# Patient Record
Sex: Male | Born: 1940 | Race: White | Hispanic: No | Marital: Married | Smoking: Former smoker
Health system: Southern US, Community
[De-identification: ages and names within clinical notes are randomized; demographics above are authoritative.]

## PROBLEM LIST (undated history)

## (undated) DIAGNOSIS — Z85828 Personal history of other malignant neoplasm of skin: Secondary | ICD-10-CM

## (undated) DIAGNOSIS — K219 Gastro-esophageal reflux disease without esophagitis: Secondary | ICD-10-CM

## (undated) DIAGNOSIS — M1990 Unspecified osteoarthritis, unspecified site: Secondary | ICD-10-CM

## (undated) DIAGNOSIS — I499 Cardiac arrhythmia, unspecified: Secondary | ICD-10-CM

## (undated) DIAGNOSIS — C61 Malignant neoplasm of prostate: Secondary | ICD-10-CM

## (undated) DIAGNOSIS — I4891 Unspecified atrial fibrillation: Secondary | ICD-10-CM

## (undated) DIAGNOSIS — C4490 Unspecified malignant neoplasm of skin, unspecified: Secondary | ICD-10-CM

## (undated) DIAGNOSIS — R7303 Prediabetes: Secondary | ICD-10-CM

## (undated) HISTORY — PX: TONSILLECTOMY: SUR1361

## (undated) HISTORY — DX: Unspecified atrial fibrillation: I48.91

---

## 1998-04-26 ENCOUNTER — Ambulatory Visit (HOSPITAL_COMMUNITY): Admission: RE | Admit: 1998-04-26 | Discharge: 1998-04-26 | Payer: Self-pay | Admitting: Gastroenterology

## 1999-05-04 ENCOUNTER — Ambulatory Visit (HOSPITAL_COMMUNITY): Admission: RE | Admit: 1999-05-04 | Discharge: 1999-05-04 | Payer: Self-pay | Admitting: Gastroenterology

## 2001-11-19 ENCOUNTER — Ambulatory Visit (HOSPITAL_COMMUNITY): Admission: RE | Admit: 2001-11-19 | Discharge: 2001-11-19 | Payer: Self-pay | Admitting: Gastroenterology

## 2004-03-09 ENCOUNTER — Encounter (INDEPENDENT_AMBULATORY_CARE_PROVIDER_SITE_OTHER): Payer: Self-pay | Admitting: *Deleted

## 2004-03-09 ENCOUNTER — Ambulatory Visit (HOSPITAL_COMMUNITY): Admission: RE | Admit: 2004-03-09 | Discharge: 2004-03-09 | Payer: Self-pay | Admitting: Gastroenterology

## 2010-05-25 ENCOUNTER — Ambulatory Visit (HOSPITAL_COMMUNITY): Admission: RE | Admit: 2010-05-25 | Discharge: 2010-05-25 | Payer: Self-pay | Admitting: Gastroenterology

## 2010-10-29 HISTORY — PX: MELANOMA EXCISION: SHX5266

## 2011-03-16 NOTE — Op Note (Signed)
NAME:  Tanner Richardson, Tanner Richardson                      ACCOUNT NO.:  [id number]   MEDICAL RECORD NO.:  [id number]                   PATIENT TYPE:  AMB   LOCATION:  ENDO                                 FACILITY:  [facility]   PHYSICIAN:  Danise Edge, M.D.                DATE OF BIRTH:  28-Oct-1941   DATE OF PROCEDURE:  03/09/2004  DATE OF DISCHARGE:                                 OPERATIVE REPORT   PROCEDURE:  Screening colonoscopy.   INDICATIONS:  Mr. Tanner Richardson, Tanner Hageman. is a 70 year old male born Feb 28, 1941.  The patient's mother died of rectal cancer at age 54,  Mr. Tanner Richardson  has undergone colonoscopic examinations to remove neoplastic polyps.  Surveillance colonoscopy with polypectomy to prevent colon cancer is  scheduled today.   ENDOSCOPIST:  Charolett Bumpers, M.D.   PREMEDICATION:  1. Demerol 50 mg.  2. Versed 7.5 mg.   DESCRIPTION OF PROCEDURE:  After obtaining informed consent, the patient was  placed in the left lateral decubitus position.  I administered intravenous  Demerol and intravenous Versed to achieve conscious sedation for the  procedure.  The patient's blood pressure, oxygen saturation and cardiac  rhythm were monitored throughout the procedure and documented in the medical  record.   Anal inspection and digital rectal examination were normal.  The Olympus  adjustable pediatric video colonoscope was introduced into the rectum and  advanced to the cecum.  Colonic preparation for the examination today was  excellent.   FINDINGS:  RECTUM:  Normal.  SIGMOID COLON:  Normal.  DESCENDING COLON:  Normal.  SPLENIC FLEXURE:  Normal.  TRANSVERSE COLON:  Normal.  HEPATIC FLEXURE:  A diminutive 1 mm sessile polyp was removed from the  hepatic flexure, with the cold biopsy forceps.  ASCENDING COLON:  Normal.  CECUM AND ILEOCECAL VALVE:  Normal.   ASSESSMENT:  A dimunitive polyp was removed from the hepatic flexure;  otherwise normal proctocolonoscopy to the cecum.   RECOMMENDATIONS:  Repeat colonoscopy in three years.                                              Danise Edge, M.D.   MJ/MEDQ  D:  03/09/2004  T:  03/09/2004  Job:  [id number]

## 2011-03-19 ENCOUNTER — Other Ambulatory Visit: Payer: Self-pay | Admitting: Dermatology

## 2011-04-11 ENCOUNTER — Other Ambulatory Visit: Payer: Self-pay | Admitting: Dermatology

## 2011-05-14 ENCOUNTER — Other Ambulatory Visit: Payer: Self-pay | Admitting: Dermatology

## 2012-10-29 HISTORY — PX: COLONOSCOPY: SHX174

## 2013-04-09 ENCOUNTER — Other Ambulatory Visit: Payer: Self-pay

## 2013-07-07 ENCOUNTER — Ambulatory Visit (INDEPENDENT_AMBULATORY_CARE_PROVIDER_SITE_OTHER): Payer: Federal, State, Local not specified - PPO | Admitting: Internal Medicine

## 2013-07-07 ENCOUNTER — Telehealth: Payer: Self-pay | Admitting: *Deleted

## 2013-07-07 VITALS — BP 118/64 | HR 75 | Resp 18 | Ht 71.0 in | Wt 211.0 lb

## 2013-07-07 DIAGNOSIS — J3489 Other specified disorders of nose and nasal sinuses: Secondary | ICD-10-CM

## 2013-07-07 DIAGNOSIS — R040 Epistaxis: Secondary | ICD-10-CM

## 2013-07-07 NOTE — Telephone Encounter (Signed)
Called patient and left message informing him of an  ENT appt with Dr. Narda Bonds this Thursday 07/09/13 at 1 pm in the afternoon. I left the contact info [address] [place] and phone [phone].

## 2013-07-07 NOTE — Progress Notes (Signed)
  Subjective:    Patient ID: Tanner Richardson, male    DOB: 02-15-1941, 72 y.o.   MRN: [id number]  HPI 72 year old male presents for evaluation of a nosebleed. Symptoms started this afternoon while lifting weights.  He has applied pressure but over the last 3 hours it has continue to bleed. Takes baby ASA daily.  No hx of bleeding disorders but did have hx of epistaxis as a child. He did have an anterior vessel cauterized at that time. Since then has not had any problems with nosebleeds or other significant bleeding problems.  Admits over the past few months that he has had increasing number of nosebleeds, especially in the morning. Has been suffering from nasal congestion and rhinorrhea.  Has been trying not to blow his nose and mostly he has only had clear drainage.  Denies dizziness, headache, or lightheadedness.   Patient is otherwise doing well with no other concerns today.       Review of Systems  HENT: Positive for nosebleeds, rhinorrhea and postnasal drip.   Gastrointestinal: Negative for nausea and vomiting.  Neurological: Negative for dizziness and headaches.       Objective:   Physical Exam  Constitutional: He is oriented to person, place, and time. He appears well-developed and well-nourished.  HENT:  Head: Normocephalic and atraumatic.  Right Ear: External ear normal.  Left Ear: External ear normal.  Nose: Epistaxis (left nostril) is observed.  There is a small nasal polyp in left nostril  Eyes: Conjunctivae are normal.  Neck: Normal range of motion.  Neurological: He is alert and oriented to person, place, and time.  Psychiatric: He has a normal mood and affect. His behavior is normal. Judgment and thought content normal.    Large clots in both nares and bleds past the one on L into throat and nose. Cotton saturated in 2% lidocaine with epi applied to both nostrils and left in place for 10 minutes. Upon removal hemostasis was obtained and did not return. There are  bleeding spots on the L septum in particular. There is a warty lesion at the lower septum not involved with the bleeding. The clots were spit out when bleeding controlled by the epineph. No bleeding point noted on R.  Patient tolerated this well.     Assessment & Plan:  Epistaxis, recurrent - Plan: Ambulatory referral to ENT  Nasal lesion - Plan: Ambulatory referral to ENT Hemostasis obtained.   Patient discharged home with instructions on how to control bleeding if it does recur. Instructed to RTC or go to ER if he is unable to stop bleeding in 20-30 minutes.  Colonoscopy tomorrow.  Follow up here as needed. Will refer to ENT for evaluation of nasal polyp and recurrent epistaxis.

## 2013-08-26 ENCOUNTER — Other Ambulatory Visit: Payer: Self-pay | Admitting: Gastroenterology

## 2013-10-20 ENCOUNTER — Other Ambulatory Visit: Payer: Self-pay | Admitting: Urology

## 2013-10-28 ENCOUNTER — Encounter (HOSPITAL_COMMUNITY): Payer: Self-pay | Admitting: Pharmacy Technician

## 2013-11-04 ENCOUNTER — Ambulatory Visit (HOSPITAL_COMMUNITY)
Admission: RE | Admit: 2013-11-04 | Discharge: 2013-11-04 | Disposition: A | Discharge status: Home / Self Care | Payer: Federal, State, Local not specified - PPO | Source: Ambulatory Visit | Attending: Urology | Admitting: Urology

## 2013-11-04 ENCOUNTER — Encounter (HOSPITAL_COMMUNITY)
Admission: RE | Admit: 2013-11-04 | Discharge: 2013-11-04 | Disposition: A | Discharge status: Home / Self Care | Payer: Federal, State, Local not specified - PPO | Source: Ambulatory Visit | Attending: Urology | Admitting: Urology

## 2013-11-04 ENCOUNTER — Encounter (HOSPITAL_COMMUNITY): Payer: Self-pay

## 2013-11-04 DIAGNOSIS — Z01812 Encounter for preprocedural laboratory examination: Secondary | ICD-10-CM | POA: Insufficient documentation

## 2013-11-04 DIAGNOSIS — Z01818 Encounter for other preprocedural examination: Secondary | ICD-10-CM | POA: Insufficient documentation

## 2013-11-04 DIAGNOSIS — C61 Malignant neoplasm of prostate: Secondary | ICD-10-CM | POA: Insufficient documentation

## 2013-11-04 DIAGNOSIS — IMO0002 Reserved for concepts with insufficient information to code with codable children: Secondary | ICD-10-CM | POA: Insufficient documentation

## 2013-11-04 HISTORY — DX: Malignant neoplasm of prostate: C61

## 2013-11-04 HISTORY — DX: Personal history of other malignant neoplasm of skin: Z85.828

## 2013-11-04 HISTORY — DX: Unspecified osteoarthritis, unspecified site: M19.90

## 2013-11-04 LAB — BASIC METABOLIC PANEL
BUN: 12 mg/dL (ref 6–23)
CALCIUM: 9.7 mg/dL (ref 8.4–10.5)
CO2: 28 mEq/L (ref 19–32)
Chloride: 103 mEq/L (ref 96–112)
Creatinine, Ser: 0.69 mg/dL (ref 0.50–1.35)
Glucose, Bld: 82 mg/dL (ref 70–99)
POTASSIUM: 4.7 meq/L (ref 3.7–5.3)
SODIUM: 140 meq/L (ref 137–147)

## 2013-11-04 LAB — CBC
HCT: 45.8 % (ref 39.0–52.0)
HEMOGLOBIN: 15.7 g/dL (ref 13.0–17.0)
MCH: 32.7 pg (ref 26.0–34.0)
MCHC: 34.3 g/dL (ref 30.0–36.0)
MCV: 95.4 fL (ref 78.0–100.0)
PLATELETS: 255 10*3/uL (ref 150–400)
RBC: 4.8 MIL/uL (ref 4.22–5.81)
RDW: 13.6 % (ref 11.5–15.5)
WBC: 5 10*3/uL (ref 4.0–10.5)

## 2013-11-04 LAB — ABO/RH: ABO/RH(D): O POS

## 2013-11-04 NOTE — Patient Instructions (Addendum)
Tanner Richardson  11/04/2013                           YOUR PROCEDURE IS SCHEDULED ON: 11/09/13               PLEASE REPORT TO SHORT STAY CENTER AT : 5:15 AM               CALL THIS NUMBER IF ANY PROBLEMS THE DAY OF SURGERY :               [phone]                      REMEMBER:   Do not eat food or drink liquids AFTER MIDNIGHT   Take these medicines the morning of surgery with A SIP OF WATER: NONE   Do not wear jewelry, make-up   Do not wear lotions, powders, or perfumes.   Do not shave legs or underarms 12 hrs. before surgery (men may shave face)  Do not bring valuables to the hospital.  Contacts, dentures or bridgework may not be worn into surgery.  Leave suitcase in the car. After surgery it may be brought to your room.  For patients admitted to the hospital more than one night, checkout time is 11:00                          The day of discharge.   Patients discharged the day of surgery will not be allowed to drive home                             If going home same day of surgery, must have someone stay with you first                           24 hrs at home and arrange for some one to drive you home from hospital.    Special Instructions:   Please read over the following fact sheets that you were given:                1. INCENTIVE SPIROMETER                      2. Tanner Richardson                                                X_____________________________________________________________________        Failure to follow these instructions may result in cancellation of your surgery

## 2013-11-06 NOTE — H&P (Signed)
Chief Complaint Prostate Cancer   Reason For Visit Reason for consult: To discuss treatment options for prostate cancer and specifically to consider a robotic prostatectomy  PCP: Dr. Azalia Bilis  Physician requesting consult: Dr. Franchot Gallo   History of Present Illness Tanner Richardson is a 73 year old who was noted to have a persistently elevated PSA of 3.97. This prompted a prostate biopsy by Dr. 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 He has no family history of prostate cancer. His is very well informed about his treatment options through his prior discussions with Dr. [name].    TNM stage: cT1c Nx Mx  PSA: 3.97  Gleason score: 4+3=7  Biopsy (09/28/13): 2/12 cores positive -- R lateral apex (< 5%, 4+3=7), R lateral mid (70%, 4+3=7)  Prostate volume: 38.0 cc    Nomogram  OC disease: 77%  EPE: 19%  SVI: 2%  LNI: 2.2%  PFS (surgery): 94% at 5 years, 91% at 10 years    Urinary function: He has minimal bothersome voiding symptoms. IPSS is 8. His main symptoms include urinary frequency and nocturia.  Erectile function: He denies erectile dysfunction. SHIM score is 24.   Past Medical History Problems  1. History of Arthritis (V13.4) 2. History of hyperlipidemia (V12.29) 3. 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 History of Murmur (785.2)  He did undergo surgical excision of his melanoma from his right chest. This was performed by Dr. Sarajane Jews and he is followed by Dr. Amy Martinique. He has not required any additional treatment and is undergoing surveillance every 3 months.   Surgical History Problems  1. History of Excision Of Lesion Of Chest Wall  Current Meds 1. Acetaminophen CAPS; 2 qam & 2 pm for arthritis pain;  Therapy: (Recorded:13Nov2014) to Recorded 2. Aspirin 81 MG Oral Tablet;  Therapy: (Recorded:13Oct2014) to Recorded 3. Daily  Multiple Vitamins TABS;  Therapy: (Recorded:13Oct2014) to Recorded 4. Fish Oil CAPS;  Therapy: (Recorded:13Oct2014) to Recorded 5. Glucosamine-Chondroitin Oral Capsule;  Therapy: (Recorded:13Oct2014) to Recorded 6. Tylenol TABS;  Therapy: (Recorded:13Oct2014) to Recorded 7. Vitamin C TABS;  Therapy: (Recorded:13Oct2014) to Recorded  Allergies Medication  1. Meloxicam TABS  Family History Problems  1. Family history of Brain Cancer (V16.8) : Father 2. Family history of Colon Cancer (V16.0) : Mother 3. Family history of Death In The Family Father : Father   dad died at age 24 brain tumor 4. Family history of Death In The Family Mother : Father   Mom died at age 8 from colon cancer  Social History Problems    Alcohol Use   2 per day   Caffeine Use   2 per day   Former smoker (V15.82)   2 ppd for 12 yrs, quit in 1960   Marital History - Currently Married   Occupation: Retired  Review of Systems Constitutional, skin, eye, otolaryngeal, hematologic/lymphatic, cardiovascular, pulmonary, endocrine, musculoskeletal, gastrointestinal, neurological and psychiatric system(s) were reviewed and pertinent findings if present are noted.    Vitals Vital Signs [Data Includes: Last 1 Day]  Recorded: [id number] 08:55AM  Weight: 210 lb  BMI Calculated: 29.29 BSA Calculated: 2.15 Blood Pressure: 146 / 76 Heart Rate: 73  Physical Exam Constitutional: Well nourished and well developed . No acute distress.  ENT:. The ears and nose are normal in appearance.  Neck: The appearance of the neck is normal and no neck mass is present.  Pulmonary: No respiratory distress,  normal respiratory rhythm and effort and clear bilateral breath sounds.  Cardiovascular: Heart rate and rhythm are normal . No peripheral edema.  Abdomen: The abdomen is soft and nontender. No masses are palpated. No CVA tenderness. No hernias are palpable. No hepatosplenomegaly noted.  Rectal: Rectal exam  demonstrates normal sphincter tone, no tenderness and no masses. Prostate size is estimated to be 40 g. The prostate has no nodularity and is not tender. The left seminal vesicle is nonpalpable. The right seminal vesicle is nonpalpable. The perineum is normal on inspection.  Lymphatics: The femoral and inguinal nodes are not enlarged or tender.  Skin: Normal skin turgor, no visible rash and no visible skin lesions.  Neuro/Psych:. Mood and affect are appropriate.    Results/Data I have independently reviewed his medical records, PSA results, and pathology report. Findings are as dictated above.     Assessment  Assessed  1. History of Excision Of Lesion Of Chest Wall  Adenocarcinoma of prostate (185) (C61)   Plan Adenocarcinoma of prostate  1. Follow-up Keep Future Appt Office  Follow-up  Status: Complete  Done: [id number]  Discussion/Summary 1. Prostate cancer: I had a detailed discussion with Mr. Dzik and his wife today. He has elected to proceed with surgical treatment of his prostate cancer.   The patient was counseled about the natural history of prostate cancer and the standard treatment options that are available for prostate cancer. It was explained to him how his age and life expectancy, clinical stage, Gleason score, and PSA affect his prognosis, the decision to proceed with additional staging studies, as well as how that information influences recommended treatment strategies. We discussed the roles for active surveillance, radiation therapy, surgical therapy, androgen deprivation, as well as ablative therapy options for the treatment of prostate cancer as appropriate to his individual cancer situation. We discussed the risks and benefits of these options with regard to their impact on cancer control and also in terms of potential adverse events, complications, and impact on quiality of life particularly related to urinary, bowel, and sexual function. The patient was encouraged to  ask questions throughout the discussion today and all questions were answered to his stated satisfaction. In addition, the patient was provided with and/or directed to appropriate resources and literature for further education about prostate cancer and treatment options.   We discussed surgical therapy for prostate cancer including the different available surgical approaches. We discussed, in detail, the risks and expectations of surgery with regard to cancer control, urinary control, and erectile function as well as the expected postoperative recovery process. Additional risks of surgery including but not limited to bleeding, infection, hernia formation, nerve damage, lymphocele formation, bowel/rectal injury potentially necessitating colostomy, damage to the urinary tract resulting in urine leakage, urethral stricture, and the cardiopulmonary risks such as myocardial infarction, stroke, death, venothromboembolism, etc. were explained. The risk of open surgical conversion for robotic/laparoscopic prostatectomy was also discussed.     We will proceed with a bilateral nerve sparing robotic-assisted laparoscopic radical prostatectomy and bilateral pelvic lymphadenectomy.    Cc: Dr. Franchot Gallo  Dr. Azalia Bilis    SignaturesElectronically signed by : Tanner Richardson, M.D.; Nov 03 2013  3:22PM EST

## 2013-11-09 ENCOUNTER — Encounter (HOSPITAL_COMMUNITY)
Admission: RE | Disposition: A | Discharge status: Home / Self Care | Payer: Self-pay | Source: Ambulatory Visit | Attending: Urology

## 2013-11-09 ENCOUNTER — Inpatient Hospital Stay (HOSPITAL_COMMUNITY): Payer: Medicare Other | Admitting: Anesthesiology

## 2013-11-09 ENCOUNTER — Encounter (HOSPITAL_COMMUNITY): Payer: Self-pay | Admitting: *Deleted

## 2013-11-09 ENCOUNTER — Encounter (HOSPITAL_COMMUNITY): Payer: Medicare Other | Admitting: Anesthesiology

## 2013-11-09 ENCOUNTER — Inpatient Hospital Stay (HOSPITAL_COMMUNITY)
Admission: RE | Admit: 2013-11-09 | Discharge: 2013-11-10 | DRG: 708 | Disposition: A | Discharge status: Home / Self Care | Payer: Medicare Other | Source: Ambulatory Visit | Attending: Urology | Admitting: Urology

## 2013-11-09 DIAGNOSIS — E785 Hyperlipidemia, unspecified: Secondary | ICD-10-CM | POA: Diagnosis present

## 2013-11-09 DIAGNOSIS — Z85820 Personal history of malignant melanoma of skin: Secondary | ICD-10-CM

## 2013-11-09 DIAGNOSIS — C61 Malignant neoplasm of prostate: Principal | ICD-10-CM | POA: Diagnosis present

## 2013-11-09 DIAGNOSIS — R350 Frequency of micturition: Secondary | ICD-10-CM | POA: Diagnosis not present

## 2013-11-09 DIAGNOSIS — Z87891 Personal history of nicotine dependence: Secondary | ICD-10-CM

## 2013-11-09 DIAGNOSIS — Z800 Family history of malignant neoplasm of digestive organs: Secondary | ICD-10-CM

## 2013-11-09 DIAGNOSIS — R351 Nocturia: Secondary | ICD-10-CM | POA: Diagnosis not present

## 2013-11-09 HISTORY — PX: ROBOT ASSISTED LAPAROSCOPIC RADICAL PROSTATECTOMY: SHX5141

## 2013-11-09 HISTORY — PX: LYMPHADENECTOMY: SHX5960

## 2013-11-09 LAB — HEMOGLOBIN AND HEMATOCRIT, BLOOD
HCT: 45.3 % (ref 39.0–52.0)
Hemoglobin: 15.9 g/dL (ref 13.0–17.0)

## 2013-11-09 LAB — TYPE AND SCREEN
ABO/RH(D): O POS
ANTIBODY SCREEN: NEGATIVE

## 2013-11-09 SURGERY — ROBOTIC ASSISTED LAPAROSCOPIC RADICAL PROSTATECTOMY LEVEL 2
Anesthesia: General

## 2013-11-09 MED ORDER — OXYCODONE HCL 5 MG PO TABS: 5.0000 mg | ORAL_TABLET | Freq: Once | ORAL | Status: DC | PRN

## 2013-11-09 MED ORDER — HYDROMORPHONE HCL PF 1 MG/ML IJ SOLN
INTRAMUSCULAR | Status: AC
  Filled 2013-11-09: qty 1

## 2013-11-09 MED ORDER — HEPARIN SODIUM (PORCINE) 1000 UNIT/ML IJ SOLN
INTRAMUSCULAR | Status: AC
  Filled 2013-11-09: qty 1

## 2013-11-09 MED ORDER — KCL IN DEXTROSE-NACL 20-5-0.45 MEQ/L-%-% IV SOLN
INTRAVENOUS | Status: DC
  Administered 2013-11-09 – 2013-11-10 (×3): via INTRAVENOUS
  Filled 2013-11-09 (×4): qty 1000

## 2013-11-09 MED ORDER — ACETAMINOPHEN 10 MG/ML IV SOLN
1000.0000 mg | Freq: Four times a day (QID) | INTRAVENOUS | Status: AC
  Administered 2013-11-09 – 2013-11-10 (×4): 1000 mg via INTRAVENOUS
  Filled 2013-11-09 (×5): qty 100

## 2013-11-09 MED ORDER — CEFAZOLIN SODIUM-DEXTROSE 2-3 GM-% IV SOLR
2.0000 g | INTRAVENOUS | Status: AC
  Administered 2013-11-09: 2 g via INTRAVENOUS

## 2013-11-09 MED ORDER — MORPHINE SULFATE 2 MG/ML IJ SOLN: 2.0000 mg | INTRAMUSCULAR | Status: DC | PRN

## 2013-11-09 MED ORDER — SODIUM CHLORIDE 0.9 % IV BOLUS (SEPSIS)
500.0000 mL | Freq: Once | INTRAVENOUS | Status: AC
  Administered 2013-11-09: 500 mL via INTRAVENOUS

## 2013-11-09 MED ORDER — PROPOFOL 10 MG/ML IV BOLUS
INTRAVENOUS | Status: DC | PRN
  Administered 2013-11-09: 150 mg via INTRAVENOUS

## 2013-11-09 MED ORDER — LACTATED RINGERS IV SOLN
INTRAVENOUS | Status: DC | PRN
  Administered 2013-11-09 (×2): via INTRAVENOUS

## 2013-11-09 MED ORDER — ROCURONIUM BROMIDE 100 MG/10ML IV SOLN
INTRAVENOUS | Status: AC
  Filled 2013-11-09: qty 1

## 2013-11-09 MED ORDER — GLYCOPYRROLATE 0.2 MG/ML IJ SOLN
INTRAMUSCULAR | Status: AC
  Filled 2013-11-09: qty 3

## 2013-11-09 MED ORDER — HYDROCODONE-ACETAMINOPHEN 5-325 MG PO TABS: 1.0000 | ORAL_TABLET | Freq: Four times a day (QID) | ORAL | Status: DC | PRN

## 2013-11-09 MED ORDER — DOCUSATE SODIUM 100 MG PO CAPS
100.0000 mg | ORAL_CAPSULE | Freq: Two times a day (BID) | ORAL | Status: DC
  Administered 2013-11-09 – 2013-11-10 (×3): 100 mg via ORAL
  Filled 2013-11-09 (×4): qty 1

## 2013-11-09 MED ORDER — CEFAZOLIN SODIUM 1-5 GM-% IV SOLN
1.0000 g | Freq: Three times a day (TID) | INTRAVENOUS | Status: AC
  Administered 2013-11-09 (×2): 1 g via INTRAVENOUS
  Filled 2013-11-09 (×2): qty 50

## 2013-11-09 MED ORDER — NEOSTIGMINE METHYLSULFATE 1 MG/ML IJ SOLN
INTRAMUSCULAR | Status: DC | PRN
  Administered 2013-11-09: 4 mg via INTRAVENOUS

## 2013-11-09 MED ORDER — METHYLENE BLUE 1 % INJ SOLN
INTRAMUSCULAR | Status: AC
  Filled 2013-11-09: qty 10

## 2013-11-09 MED ORDER — BUPIVACAINE-EPINEPHRINE 0.25% -1:200000 IJ SOLN
INTRAMUSCULAR | Status: DC | PRN
  Administered 2013-11-09: 30 mL

## 2013-11-09 MED ORDER — CIPROFLOXACIN HCL 500 MG PO TABS: 500.0000 mg | ORAL_TABLET | Freq: Two times a day (BID) | ORAL | Status: DC

## 2013-11-09 MED ORDER — ROCURONIUM BROMIDE 100 MG/10ML IV SOLN
INTRAVENOUS | Status: DC | PRN
  Administered 2013-11-09: 30 mg via INTRAVENOUS
  Administered 2013-11-09 (×2): 10 mg via INTRAVENOUS

## 2013-11-09 MED ORDER — ATROPINE SULFATE 0.4 MG/ML IJ SOLN
INTRAMUSCULAR | Status: AC
  Filled 2013-11-09: qty 1

## 2013-11-09 MED ORDER — LACTATED RINGERS IR SOLN
Status: DC | PRN
  Administered 2013-11-09: 1000 mL

## 2013-11-09 MED ORDER — FENTANYL CITRATE 0.05 MG/ML IJ SOLN
INTRAMUSCULAR | Status: AC
  Filled 2013-11-09: qty 5

## 2013-11-09 MED ORDER — SUCCINYLCHOLINE CHLORIDE 20 MG/ML IJ SOLN
INTRAMUSCULAR | Status: DC | PRN
  Administered 2013-11-09: 100 mg via INTRAVENOUS

## 2013-11-09 MED ORDER — DEXAMETHASONE SODIUM PHOSPHATE 10 MG/ML IJ SOLN
INTRAMUSCULAR | Status: AC
  Filled 2013-11-09: qty 1

## 2013-11-09 MED ORDER — MEPERIDINE HCL 50 MG/ML IJ SOLN
INTRAMUSCULAR | Status: AC
  Filled 2013-11-09: qty 1

## 2013-11-09 MED ORDER — HYDROMORPHONE HCL PF 1 MG/ML IJ SOLN
0.2500 mg | INTRAMUSCULAR | Status: DC | PRN
  Administered 2013-11-09 (×4): 0.5 mg via INTRAVENOUS

## 2013-11-09 MED ORDER — PROPOFOL 10 MG/ML IV BOLUS
INTRAVENOUS | Status: AC
  Filled 2013-11-09: qty 20

## 2013-11-09 MED ORDER — GLYCOPYRROLATE 0.2 MG/ML IJ SOLN
INTRAMUSCULAR | Status: DC | PRN
  Administered 2013-11-09: 0.6 mg via INTRAVENOUS

## 2013-11-09 MED ORDER — DEXAMETHASONE SODIUM PHOSPHATE 10 MG/ML IJ SOLN
INTRAMUSCULAR | Status: DC | PRN
  Administered 2013-11-09: 10 mg via INTRAVENOUS

## 2013-11-09 MED ORDER — OXYCODONE HCL 5 MG/5ML PO SOLN
5.0000 mg | Freq: Once | ORAL | Status: DC | PRN
  Filled 2013-11-09: qty 5

## 2013-11-09 MED ORDER — ONDANSETRON HCL 4 MG/2ML IJ SOLN
INTRAMUSCULAR | Status: AC
  Filled 2013-11-09: qty 2

## 2013-11-09 MED ORDER — DIPHENHYDRAMINE HCL 12.5 MG/5ML PO ELIX: 12.5000 mg | ORAL_SOLUTION | Freq: Four times a day (QID) | ORAL | Status: DC | PRN

## 2013-11-09 MED ORDER — DIPHENHYDRAMINE HCL 50 MG/ML IJ SOLN: 12.5000 mg | Freq: Four times a day (QID) | INTRAMUSCULAR | Status: DC | PRN

## 2013-11-09 MED ORDER — MEPERIDINE HCL 50 MG/ML IJ SOLN
6.2500 mg | INTRAMUSCULAR | Status: DC | PRN
  Administered 2013-11-09 (×2): 12.5 mg via INTRAVENOUS

## 2013-11-09 MED ORDER — BUPIVACAINE-EPINEPHRINE PF 0.25-1:200000 % IJ SOLN
INTRAMUSCULAR | Status: AC
  Filled 2013-11-09: qty 30

## 2013-11-09 MED ORDER — NEOSTIGMINE METHYLSULFATE 1 MG/ML IJ SOLN
INTRAMUSCULAR | Status: AC
  Filled 2013-11-09: qty 10

## 2013-11-09 MED ORDER — LACTATED RINGERS IV SOLN
INTRAVENOUS | Status: DC | PRN
  Administered 2013-11-09: 08:00:00

## 2013-11-09 MED ORDER — LACTATED RINGERS IV SOLN: INTRAVENOUS | Status: DC

## 2013-11-09 MED ORDER — SODIUM CHLORIDE 0.9 % IR SOLN
Status: DC | PRN
  Administered 2013-11-09: 1000 mL via INTRAVESICAL

## 2013-11-09 MED ORDER — CEFAZOLIN SODIUM-DEXTROSE 2-3 GM-% IV SOLR
INTRAVENOUS | Status: AC
  Filled 2013-11-09: qty 50

## 2013-11-09 MED ORDER — ONDANSETRON HCL 4 MG/2ML IJ SOLN
INTRAMUSCULAR | Status: DC | PRN
  Administered 2013-11-09: 4 mg via INTRAVENOUS

## 2013-11-09 MED ORDER — SODIUM CHLORIDE 0.9 % IV BOLUS (SEPSIS)
1000.0000 mL | Freq: Once | INTRAVENOUS | Status: AC
  Administered 2013-11-09: 1000 mL via INTRAVENOUS

## 2013-11-09 MED ORDER — PROMETHAZINE HCL 25 MG/ML IJ SOLN: 6.2500 mg | INTRAMUSCULAR | Status: DC | PRN

## 2013-11-09 MED ORDER — FENTANYL CITRATE 0.05 MG/ML IJ SOLN
INTRAMUSCULAR | Status: DC | PRN
  Administered 2013-11-09 (×2): 50 ug via INTRAVENOUS
  Administered 2013-11-09: 100 ug via INTRAVENOUS
  Administered 2013-11-09 (×6): 50 ug via INTRAVENOUS

## 2013-11-09 SURGICAL SUPPLY — 42 items
CABLE HIGH FREQUENCY MONO STRZ (ELECTRODE) ×4 IMPLANT
CANISTER SUCTION 2500CC (MISCELLANEOUS) ×4 IMPLANT
CATH FOLEY 2WAY SLVR 18FR 30CC (CATHETERS) ×4 IMPLANT
CATH ROBINSON RED A/P 16FR (CATHETERS) ×4 IMPLANT
CATH ROBINSON RED A/P 8FR (CATHETERS) ×4 IMPLANT
CATH TIEMANN FOLEY 18FR 5CC (CATHETERS) ×4 IMPLANT
CHLORAPREP W/TINT 26ML (MISCELLANEOUS) ×4 IMPLANT
CLIP LIGATING HEM O LOK PURPLE (MISCELLANEOUS) ×8 IMPLANT
COVER SURGICAL LIGHT HANDLE (MISCELLANEOUS) ×4 IMPLANT
COVER TIP SHEARS 8 DVNC (MISCELLANEOUS) ×2 IMPLANT
COVER TIP SHEARS 8MM DA VINCI (MISCELLANEOUS) ×2
CUTTER ECHEON FLEX ENDO 45 340 (ENDOMECHANICALS) ×4 IMPLANT
DECANTER SPIKE VIAL GLASS SM (MISCELLANEOUS) IMPLANT
DERMABOND ADVANCED (GAUZE/BANDAGES/DRESSINGS) ×2
DERMABOND ADVANCED .7 DNX12 (GAUZE/BANDAGES/DRESSINGS) ×2 IMPLANT
DRAPE SURG IRRIG POUCH 19X23 (DRAPES) ×4 IMPLANT
DRSG TEGADERM 4X4.75 (GAUZE/BANDAGES/DRESSINGS) ×4 IMPLANT
DRSG TEGADERM 6X8 (GAUZE/BANDAGES/DRESSINGS) ×8 IMPLANT
ELECT REM PT RETURN 9FT ADLT (ELECTROSURGICAL) ×4
ELECTRODE REM PT RTRN 9FT ADLT (ELECTROSURGICAL) ×2 IMPLANT
GLOVE BIO SURGEON STRL SZ 6.5 (GLOVE) ×3 IMPLANT
GLOVE BIO SURGEONS STRL SZ 6.5 (GLOVE) ×1
GLOVE BIOGEL M STRL SZ7.5 (GLOVE) ×40 IMPLANT
GOWN STRL REUS W/TWL LRG LVL3 (GOWN DISPOSABLE) ×28 IMPLANT
GOWN STRL REUS W/TWL XL LVL3 (GOWN DISPOSABLE) IMPLANT
HOLDER FOLEY CATH W/STRAP (MISCELLANEOUS) ×4 IMPLANT
IV LACTATED RINGERS 1000ML (IV SOLUTION) ×4 IMPLANT
KIT ACCESSORY DA VINCI DISP (KITS) ×2
KIT ACCESSORY DVNC DISP (KITS) ×2 IMPLANT
NDL SAFETY ECLIPSE 18X1.5 (NEEDLE) ×2 IMPLANT
NEEDLE HYPO 18GX1.5 SHARP (NEEDLE) ×2
PACK ROBOT UROLOGY CUSTOM (CUSTOM PROCEDURE TRAY) ×4 IMPLANT
RELOAD GREEN ECHELON 45 (STAPLE) ×4 IMPLANT
SET TUBE IRRIG SUCTION NO TIP (IRRIGATION / IRRIGATOR) ×4 IMPLANT
SOLUTION ELECTROLUBE (MISCELLANEOUS) ×4 IMPLANT
SUT ETHILON 3 0 PS 1 (SUTURE) ×4 IMPLANT
SUT MNCRL AB 4-0 PS2 18 (SUTURE) ×8 IMPLANT
SUT VICRYL 0 UR6 27IN ABS (SUTURE) ×8 IMPLANT
SYR 27GX1/2 1ML LL SAFETY (SYRINGE) ×4 IMPLANT
TOWEL OR 17X26 10 PK STRL BLUE (TOWEL DISPOSABLE) ×4 IMPLANT
TOWEL OR NON WOVEN STRL DISP B (DISPOSABLE) ×4 IMPLANT
WATER STERILE IRR 1500ML POUR (IV SOLUTION) ×8 IMPLANT

## 2013-11-09 NOTE — Progress Notes (Signed)
Pt only had 75cc of urine output from 1200-1900. PA on call notified. New orders received. Will continue to monitor and pass on to night RN.

## 2013-11-09 NOTE — Progress Notes (Signed)
Patient ID: Tanner Richardson, male   DOB: [birth year], 73 y.o.   MRN: [id number] Post-op note  Subjective: The patient is doing well.  No complaints. Has amb.  Denies N/V.  Objective: Vital signs in last 24 hours: Temp:  [97.7 F (36.5 C)-98.4 F (36.9 C)] 98.2 F (36.8 C) (01/12 1237) Pulse Rate:  [71-90] 90 (01/12 1237) Resp:  [3-18] 15 (01/12 1237) BP: (138-171)/(72-88) 171/88 mmHg (01/12 1237) SpO2:  [95 %-100 %] 95 % (01/12 1237) Weight:  [100.699 kg (222 lb)] 100.699 kg (222 lb) (01/12 8032)  Intake/Output from previous day:   Intake/Output this shift: Total I/O In: 2250 [I.V.:1250; IV Piggyback:1000] Out: 180 [Urine:50; Drains:30; Blood:100]  Physical Exam:  General: Alert and oriented. Abdomen: Soft, Nondistended. Incisions: Clean and dry. Urine: dark red  Lab Results:  Recent Labs  11/09/13 1048  HGB 15.9  HCT 45.3    Assessment/Plan: POD#0   1) Continue to monitor  2) Amb, IS, DVT prophy, clears, pain control     LOS: 0 days   YARBROUGH,Kharson Rasmusson G. 11/09/2013, 3:50 PM

## 2013-11-09 NOTE — Anesthesia Postprocedure Evaluation (Signed)
Anesthesia Post Note  Patient: Tanner Richardson  Procedure(s) Performed: Procedure(s) (LRB): ROBOTIC ASSISTED LAPAROSCOPIC RADICAL PROSTATECTOMY LEVEL 2 (N/A) LYMPHADENECTOMY (Bilateral)  Anesthesia type: General  Patient location: PACU  Post pain: Pain level controlled  Post assessment: Post-op Vital signs reviewed  Last Vitals: BP 171/88  Pulse 90  Temp(Src) 36.8 C (Oral)  Resp 15  Ht 5\' 11"  (1.803 m)  Wt 222 lb (100.699 kg)  BMI 30.98 kg/m2  SpO2 95%  Post vital signs: Reviewed  Level of consciousness: sedated  Complications: No apparent anesthesia complications

## 2013-11-09 NOTE — Transfer of Care (Signed)
Immediate Anesthesia Transfer of Care Note  Patient: Tanner Richardson  Procedure(s) Performed: Procedure(s): ROBOTIC ASSISTED LAPAROSCOPIC RADICAL PROSTATECTOMY LEVEL 2 (N/A) LYMPHADENECTOMY (Bilateral)  Patient Location: PACU  Anesthesia Type:General  Level of Consciousness: awake, alert  and patient cooperative  Airway & Oxygen Therapy: Patient Spontanous Breathing and Patient connected to face mask oxygen  Post-op Assessment: Report given to PACU RN and Post -op Vital signs reviewed and stable  Post vital signs: Reviewed and stable  Complications: No apparent anesthesia complications

## 2013-11-09 NOTE — Interval H&P Note (Signed)
History and Physical Interval Note:  11/09/2013 7:10 AM  Tanner Richardson  has presented today for surgery, with the diagnosis of PROSTATE CANCER  The various methods of treatment have been discussed with the patient and family. After consideration of risks, benefits and other options for treatment, the patient has consented to  Procedure(s): ROBOTIC ASSISTED LAPAROSCOPIC RADICAL PROSTATECTOMY LEVEL 2 (N/A) LYMPHADENECTOMY (Bilateral) as a surgical intervention .  The patient's history has been reviewed, patient examined, no change in status, stable for surgery.  I have reviewed the patient's chart and labs.  Questions were answered to the patient's satisfaction.     Miroslav Gin,LES

## 2013-11-09 NOTE — Discharge Instructions (Signed)
1. Activity:  You are encouraged to ambulate frequently (about every hour during waking hours) to help prevent blood clots from forming in your legs or lungs.  However, you should not engage in any heavy lifting (> 10-15 lbs), strenuous activity, or straining. °2. Diet: You should continue a clear liquid diet until passing gas from below.  Once this occurs, you may advance your diet to a soft diet that would be easy to digest (i.e soups, scrambled eggs, mashed potatoes, etc.) for 24 hours just as you would if getting over a bad stomach flu.  If tolerating this diet well for 24 hours, you may then begin eating regular food.  It will be normal to have some amount of bloating, nausea, and abdominal discomfort intermittently. °3. Prescriptions:  You will be provided a prescription for pain medication to take as needed.  If your pain is not severe enough to require the prescription pain medication, you may take extra strength Tylenol instead.  You should also take an over the counter stool softener (Colace 100 mg twice daily) to avoid straining with bowel movements as the pain medication may constipate you. Finally, you will also be provided a prescription for an antibiotic to begin the day prior to your return visit in the office for catheter removal. °4. Catheter care: You will be taught how to take care of the catheter by the nursing staff prior to discharge from the hospital.  You may use both a leg bag and the larger bedside bag but it is recommended to at least use the bigger bedside bag at nighttime as the leg bag is small and will fill up overnight and also does not drain as well when lying flat. You may periodically feel a strong urge to void with the catheter in place.  This is a bladder spasm and most often can occur when having a bowel movement or when you are moving around. It is typically self-limited and usually will stop after a few minutes.  You may use some Vaseline or Neosporin around the tip of the  catheter to reduce friction at the tip of the penis. °5. Incisions: You may remove your dressing bandages the 2nd day after surgery.  You most likely will have a few small staples in each of the incisions and once the bandages are removed, the incisions may stay open to air.  You may start showering (not soaking or bathing in water) 48 hours after surgery and the incisions simply need to be patted dry after the shower.  No additional care is needed. °6. What to call us about: You should call the office ([phone]) if you develop fever > 101, persistent vomiting, or the catheter stops draining. Also, feel free to call with any other questions you may have and remember the handout that was provided to you as a reference preoperatively which answers many of the common questions that arise after surgery. ° °You may resume aspirin, vitamins, and supplements 7 days after surgery. °

## 2013-11-09 NOTE — Progress Notes (Signed)
Utilization review completed.  

## 2013-11-09 NOTE — Anesthesia Preprocedure Evaluation (Signed)
Anesthesia Evaluation  Patient identified by MRN, date of birth, ID band Patient awake    Reviewed: Allergy & Precautions, H&P , NPO status , Patient's Chart, lab work & pertinent test results  Airway Mallampati: II TM Distance: >3 FB Neck ROM: Full    Dental  (+) Dental Advisory Given   Pulmonary former smoker,  breath sounds clear to auscultation        Cardiovascular negative cardio ROS  Rhythm:Regular Rate:Normal     Neuro/Psych negative neurological ROS  negative psych ROS   GI/Hepatic negative GI ROS, Neg liver ROS,   Endo/Other  negative endocrine ROS  Renal/GU negative Renal ROS     Musculoskeletal negative musculoskeletal ROS (+)   Abdominal   Peds  Hematology negative hematology ROS (+)   Anesthesia Other Findings   Reproductive/Obstetrics                           Anesthesia Physical Anesthesia Plan  ASA: II  Anesthesia Plan: General   Post-op Pain Management:    Induction: Intravenous  Airway Management Planned: Oral ETT  Additional Equipment:   Intra-op Plan:   Post-operative Plan: Extubation in OR  Informed Consent: I have reviewed the patients History and Physical, chart, labs and discussed the procedure including the risks, benefits and alternatives for the proposed anesthesia with the patient or authorized representative who has indicated his/her understanding and acceptance.   Dental advisory given  Plan Discussed with: CRNA  Anesthesia Plan Comments:         Anesthesia Quick Evaluation

## 2013-11-09 NOTE — Preoperative (Signed)
Beta Blockers   Reason not to administer Beta Blockers:Not Applicable 

## 2013-11-09 NOTE — Op Note (Signed)

## 2013-11-10 ENCOUNTER — Encounter (HOSPITAL_COMMUNITY): Payer: Self-pay | Admitting: Urology

## 2013-11-10 LAB — HEMOGLOBIN AND HEMATOCRIT, BLOOD
HEMATOCRIT: 39.5 % (ref 39.0–52.0)
Hemoglobin: 13.6 g/dL (ref 13.0–17.0)

## 2013-11-10 MED ORDER — BISACODYL 10 MG RE SUPP
10.0000 mg | Freq: Once | RECTAL | Status: AC
  Administered 2013-11-10: 08:00:00 10 mg via RECTAL
  Filled 2013-11-10: qty 1

## 2013-11-10 MED ORDER — ACETAMINOPHEN 325 MG PO TABS
650.0000 mg | ORAL_TABLET | Freq: Four times a day (QID) | ORAL | Status: DC | PRN
  Administered 2013-11-10: 650 mg via ORAL
  Filled 2013-11-10: qty 2

## 2013-11-10 MED ORDER — HYDROCODONE-ACETAMINOPHEN 5-325 MG PO TABS
1.0000 | ORAL_TABLET | Freq: Four times a day (QID) | ORAL | Status: DC | PRN
  Administered 2013-11-10: 1 via ORAL
  Filled 2013-11-10: qty 1

## 2013-11-10 NOTE — Discharge Summary (Signed)
  Date of admission: 11/09/2013  Date of discharge: 11/10/2013  Admission diagnosis: Prostate Cancer  Discharge diagnosis: Prostate Cancer  History and Physical: For full details, please see admission history and physical. Briefly, Tanner Richardson is a 73 y.o. gentleman with localized prostate cancer.  After discussing management/treatment options, he elected to proceed with surgical treatment.  Hospital Course: Tanner Richardson was taken to the operating room on 11/09/2013 and underwent a robotic assisted laparoscopic radical prostatectomy. He tolerated this procedure well and without complications. Postoperatively, he was able to be transferred to a regular hospital room following recovery from anesthesia.  He was able to begin ambulating the night of surgery. He remained hemodynamically stable overnight.  He had excellent urine output with appropriately minimal output from his pelvic drain and his pelvic drain was removed on POD #1.  He was transitioned to oral pain medication, tolerated a clear liquid diet, and had met all discharge criteria and was able to be discharged home later on POD#1.  Laboratory values:  Recent Labs  11/09/13 1048 11/10/13 0423  HGB 15.9 13.6  HCT 45.3 39.5    Disposition: Home  Discharge instruction: He was instructed to be ambulatory but to refrain from heavy lifting, strenuous activity, or driving. He was instructed on urethral catheter care.  Discharge medications:     Medication List    STOP taking these medications       acetaminophen 500 MG tablet  Commonly known as:  TYLENOL     aspirin EC 81 MG tablet     Fish Oil 1000 MG Caps     glucosamine-chondroitin 500-400 MG tablet     multivitamin with minerals Tabs tablet     vitamin C 500 MG tablet  Commonly known as:  ASCORBIC ACID      TAKE these medications       ciprofloxacin 500 MG tablet  Commonly known as:  CIPRO  Take 1 tablet (500 mg total) by mouth 2 (two) times daily. Start  day prior to office visit for foley removal     HYDROcodone-acetaminophen 5-325 MG per tablet  Commonly known as:  NORCO  Take 1-2 tablets by mouth every 6 (six) hours as needed.     sodium chloride 0.65 % Soln nasal spray  Commonly known as:  OCEAN  Place 1 spray into both nostrils 2 (two) times daily as needed for congestion.        Followup: He will followup in 1 week for catheter removal and to discuss his surgical pathology results.

## 2013-11-10 NOTE — Progress Notes (Signed)
Patient ID: Tanner Richardson, male   DOB: [birth year], 73 y.o.   MRN: [id number]  1 Day Post-Op Subjective: The patient is doing well.  No nausea or vomiting. Pain is adequately controlled.  Objective: Vital signs in last 24 hours: Temp:  [97.7 F (36.5 C)-98.7 F (37.1 C)] 97.9 F (36.6 C) (01/13 0451) Pulse Rate:  [69-97] 69 (01/13 0451) Resp:  [3-19] 18 (01/13 0451) BP: (119-171)/(65-88) 137/72 mmHg (01/13 0451) SpO2:  [95 %-100 %] 96 % (01/13 0451)  Intake/Output from previous day: 01/12 0701 - 01/13 0700 In: 5490 [P.O.:360; I.V.:3930; IV Piggyback:1200] Out: 2860 [Urine:2675; Drains:85; Blood:100] Intake/Output this shift:    Physical Exam:  General: Alert and oriented. CV: RRR Lungs: Clear bilaterally. GI: Soft, Nondistended. Incisions: Dressings intact. Urine: Clear Extremities: Nontender, no erythema, no edema.  Lab Results:  Recent Labs  11/09/13 1048 11/10/13 0423  HGB 15.9 13.6  HCT 45.3 39.5      Assessment/Plan: POD# 1 s/p robotic prostatectomy.  1) SL IVF 2) Ambulate, Incentive spirometry 3) Transition to oral pain medication 4) Dulcolax suppository 5) D/C pelvic drain 6) Plan for likely discharge later today   Pryor Curia. MD   LOS: 1 day   Antha Niday,LES 11/10/2013, 7:16 AM

## 2014-03-07 IMAGING — CR DG CHEST 2V
2 series · 2 of 2 positions shown · non-contrast
Comparison: None

CLINICAL DATA: Preoperative study for prostate malignancy.

EXAM:
CHEST  2 VIEW

[w chest pa]
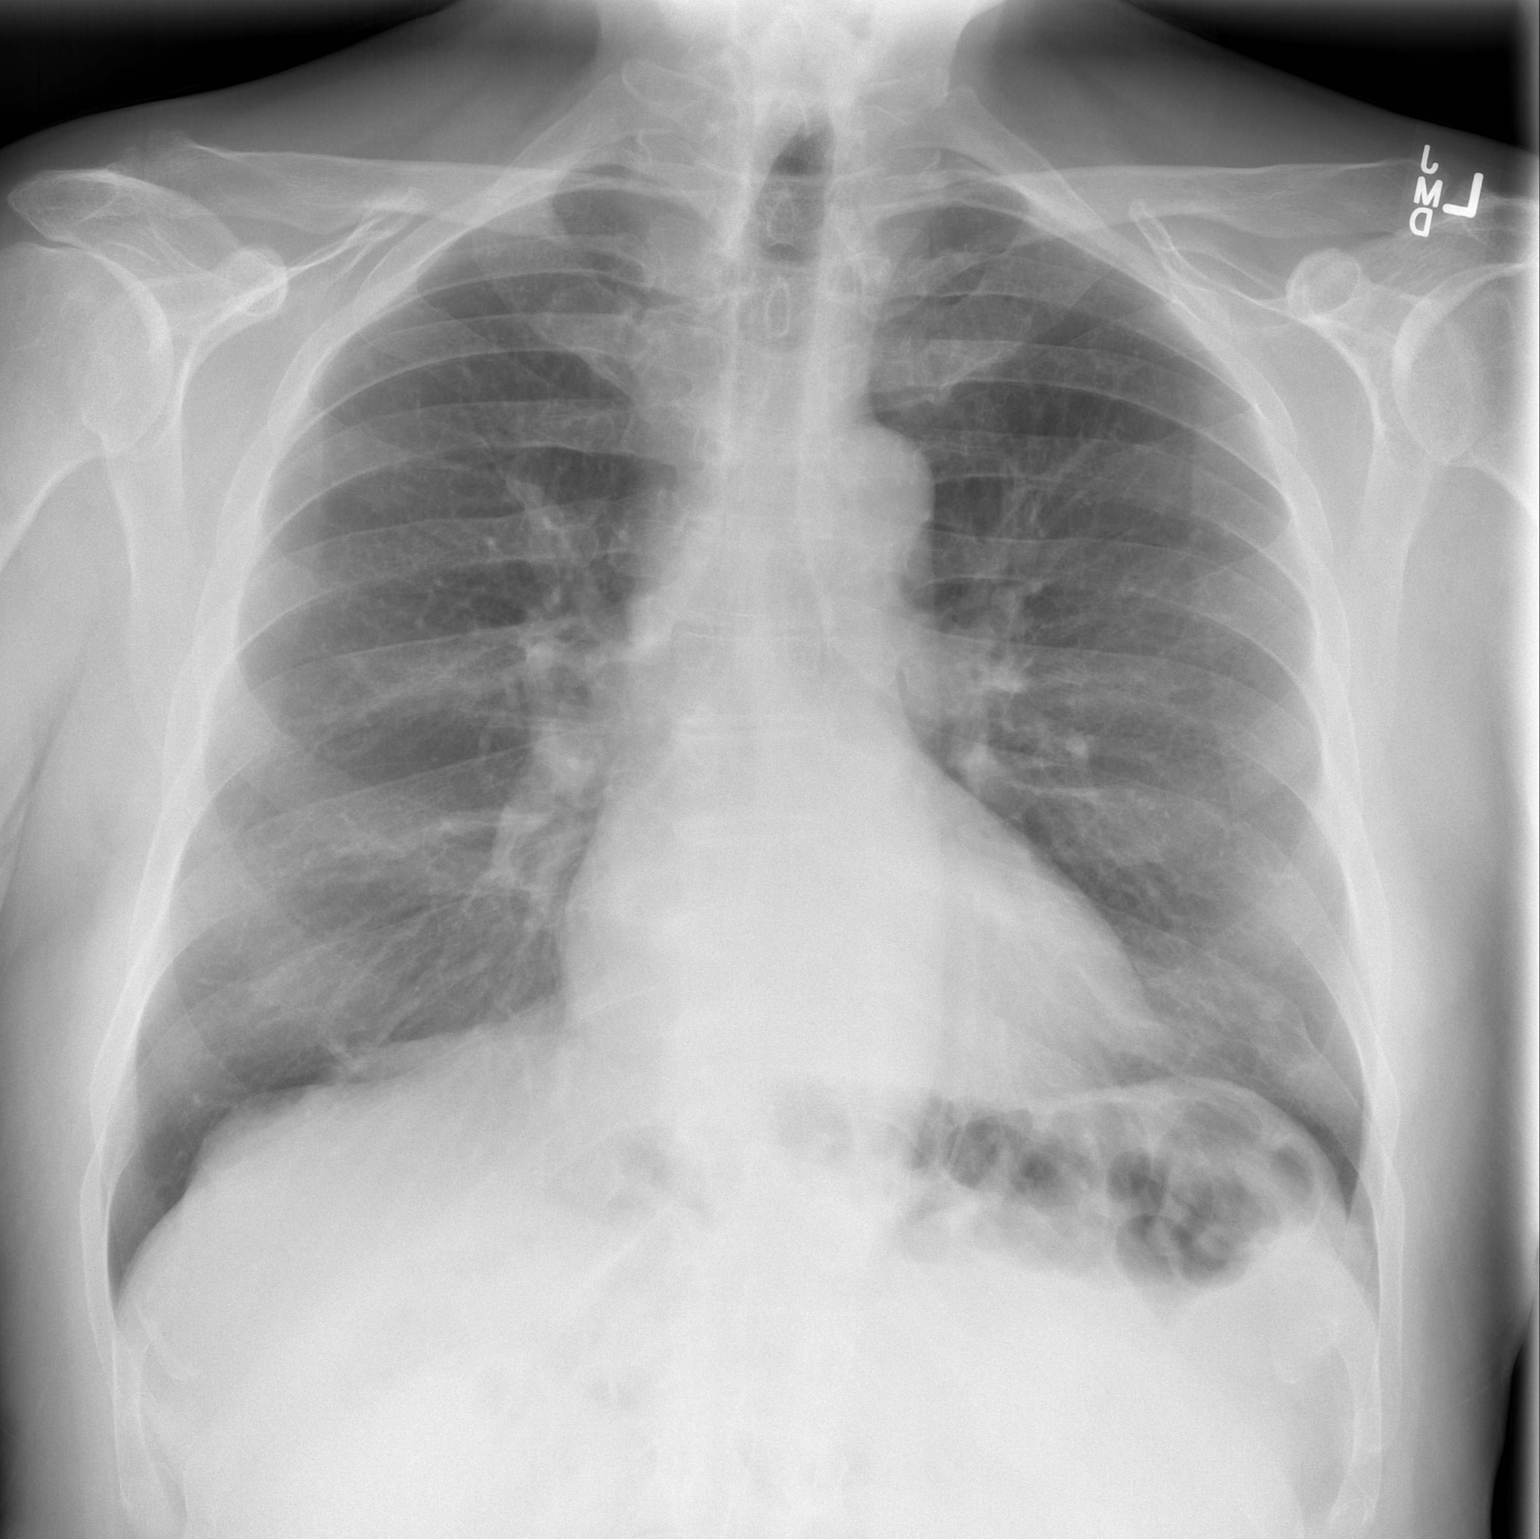

[w chest lat]
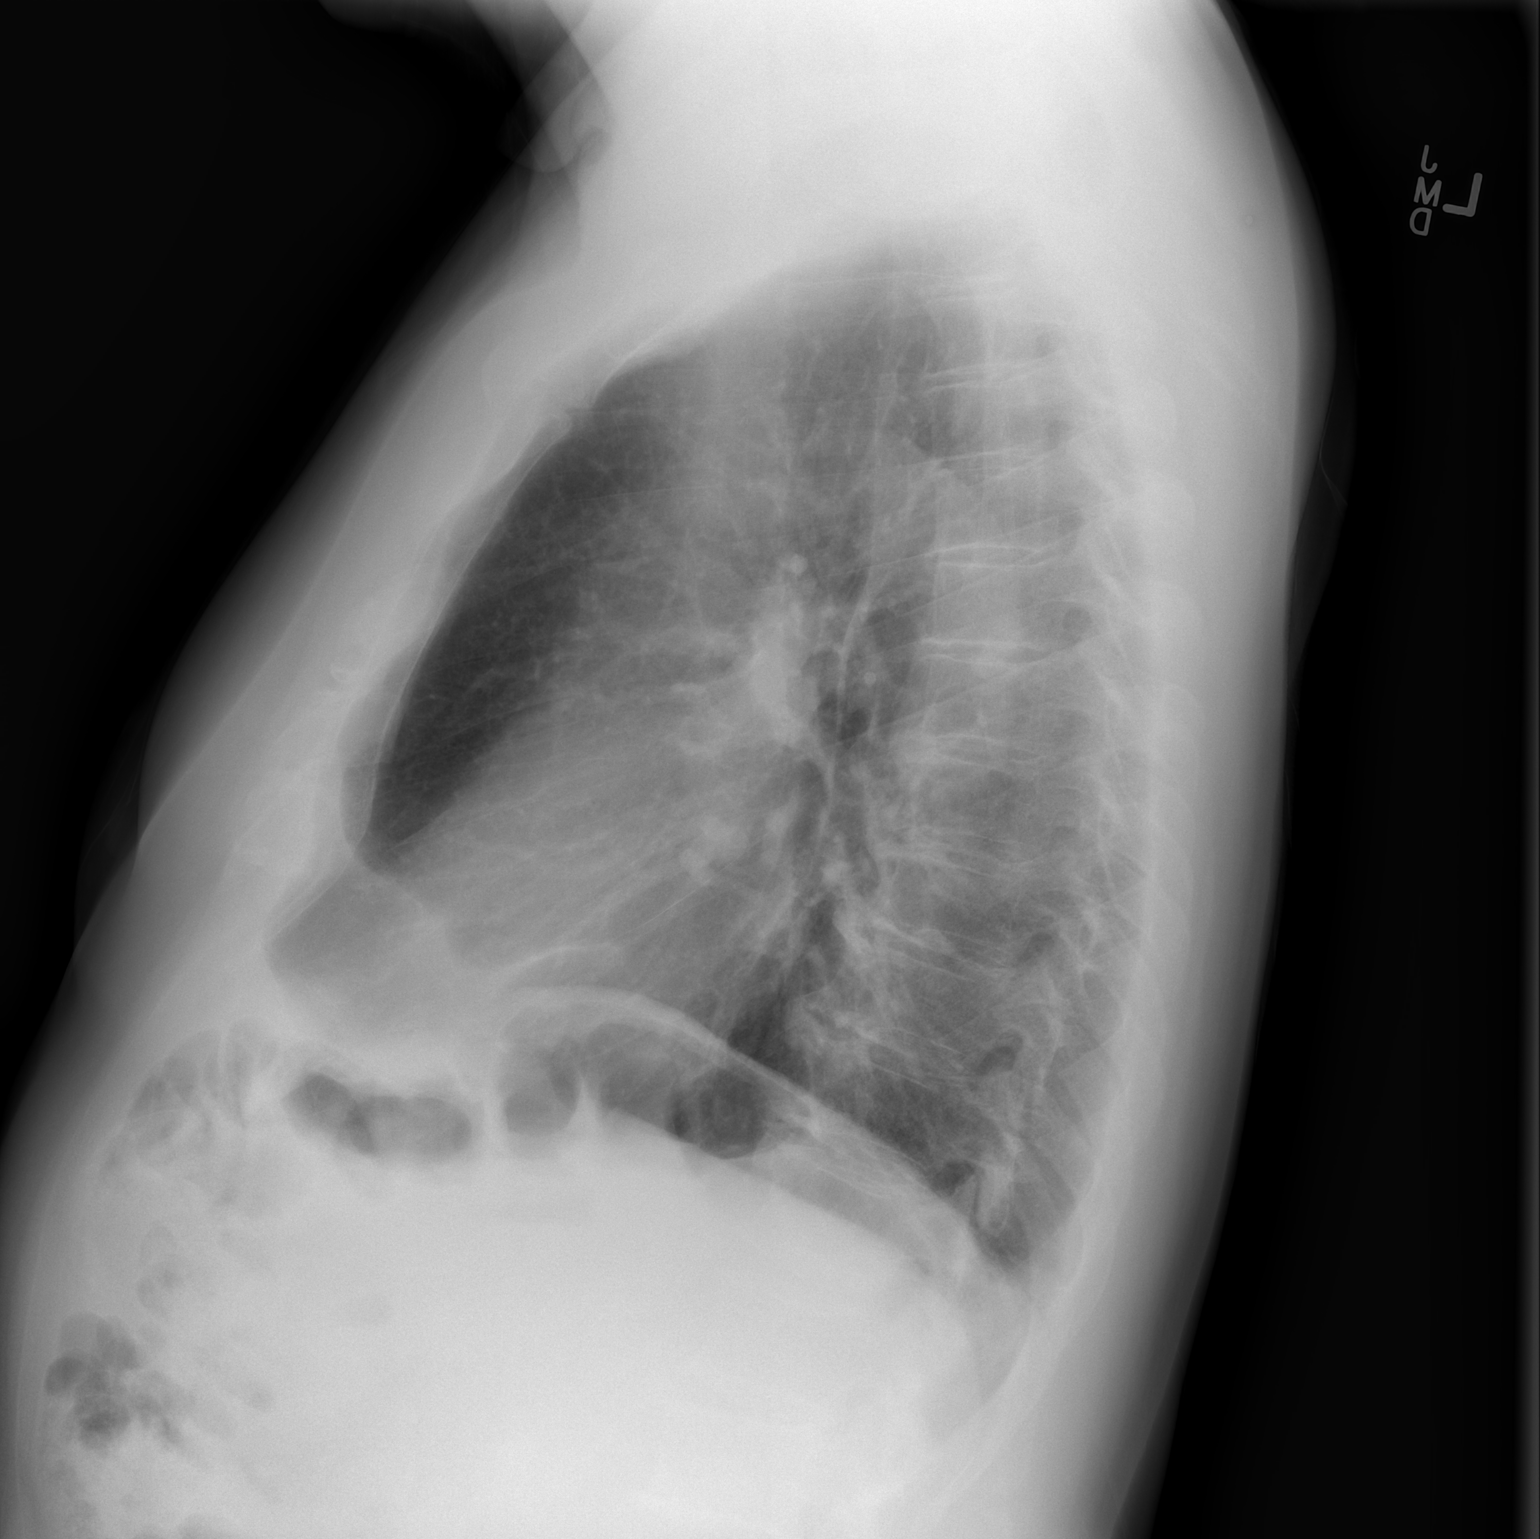

[2 of 2 positions shown; findings below may reference images not displayed]

FINDINGS: The lungs are adequately inflated. There is no focal infiltrate. The
cardiac silhouette is normal in size. The pulmonary vascularity is
not engorged. There is no pleural effusion or pneumothorax. The bony
structures exhibit no lytic or blastic lesions. There is mild
degenerative disc change of the mid and lower thoracic spine.
IMPRESSION: There is no evidence of pneumonia nor CHF nor other acute
cardiopulmonary abnormality.

## 2014-05-24 ENCOUNTER — Other Ambulatory Visit: Payer: Self-pay | Admitting: Dermatology

## 2015-07-12 ENCOUNTER — Other Ambulatory Visit: Payer: Self-pay | Admitting: Family Medicine

## 2015-07-12 ENCOUNTER — Ambulatory Visit
Admission: RE | Admit: 2015-07-12 | Discharge: 2015-07-12 | Disposition: A | Discharge status: Home / Self Care | Payer: Federal, State, Local not specified - PPO | Source: Ambulatory Visit | Attending: Family Medicine | Admitting: Family Medicine

## 2015-07-12 DIAGNOSIS — R600 Localized edema: Secondary | ICD-10-CM

## 2015-07-18 ENCOUNTER — Other Ambulatory Visit: Payer: Self-pay | Admitting: Family Medicine

## 2015-07-18 ENCOUNTER — Ambulatory Visit
Admission: RE | Admit: 2015-07-18 | Discharge: 2015-07-18 | Disposition: A | Discharge status: Home / Self Care | Payer: Federal, State, Local not specified - PPO | Source: Ambulatory Visit | Attending: Family Medicine | Admitting: Family Medicine

## 2015-07-18 DIAGNOSIS — R911 Solitary pulmonary nodule: Secondary | ICD-10-CM

## 2015-07-19 ENCOUNTER — Other Ambulatory Visit: Payer: Self-pay | Admitting: Family Medicine

## 2015-07-19 DIAGNOSIS — R911 Solitary pulmonary nodule: Secondary | ICD-10-CM

## 2015-07-20 ENCOUNTER — Ambulatory Visit
Admission: RE | Admit: 2015-07-20 | Discharge: 2015-07-20 | Disposition: A | Discharge status: Home / Self Care | Payer: Federal, State, Local not specified - PPO | Source: Ambulatory Visit | Attending: Family Medicine | Admitting: Family Medicine

## 2015-07-20 DIAGNOSIS — R911 Solitary pulmonary nodule: Secondary | ICD-10-CM

## 2015-07-20 MED ORDER — IOPAMIDOL (ISOVUE-300) INJECTION 61%
75.0000 mL | Freq: Once | INTRAVENOUS | Status: AC | PRN
  Administered 2015-07-20: 75 mL via INTRAVENOUS

## 2015-07-25 ENCOUNTER — Other Ambulatory Visit: Payer: Self-pay | Admitting: Surgery

## 2015-08-12 ENCOUNTER — Encounter (HOSPITAL_COMMUNITY)
Admission: RE | Admit: 2015-08-12 | Discharge: 2015-08-12 | Disposition: A | Discharge status: Home / Self Care | Payer: Federal, State, Local not specified - PPO | Source: Ambulatory Visit | Attending: Surgery | Admitting: Surgery

## 2015-08-12 ENCOUNTER — Encounter (HOSPITAL_COMMUNITY): Payer: Self-pay

## 2015-08-12 DIAGNOSIS — I440 Atrioventricular block, first degree: Secondary | ICD-10-CM | POA: Insufficient documentation

## 2015-08-12 DIAGNOSIS — K4090 Unilateral inguinal hernia, without obstruction or gangrene, not specified as recurrent: Secondary | ICD-10-CM | POA: Diagnosis not present

## 2015-08-12 DIAGNOSIS — Z01812 Encounter for preprocedural laboratory examination: Secondary | ICD-10-CM | POA: Insufficient documentation

## 2015-08-12 DIAGNOSIS — Z01818 Encounter for other preprocedural examination: Secondary | ICD-10-CM | POA: Diagnosis present

## 2015-08-12 HISTORY — DX: Cardiac arrhythmia, unspecified: I49.9

## 2015-08-12 HISTORY — DX: Gastro-esophageal reflux disease without esophagitis: K21.9

## 2015-08-12 LAB — CBC
HCT: 43 % (ref 39.0–52.0)
HEMOGLOBIN: 14.3 g/dL (ref 13.0–17.0)
MCH: 32.9 pg (ref 26.0–34.0)
MCHC: 33.3 g/dL (ref 30.0–36.0)
MCV: 98.9 fL (ref 78.0–100.0)
PLATELETS: 186 10*3/uL (ref 150–400)
RBC: 4.35 MIL/uL (ref 4.22–5.81)
RDW: 13.4 % (ref 11.5–15.5)
WBC: 3.9 10*3/uL — AB (ref 4.0–10.5)

## 2015-08-12 LAB — BASIC METABOLIC PANEL
ANION GAP: 5 (ref 5–15)
BUN: 15 mg/dL (ref 6–20)
CALCIUM: 8.9 mg/dL (ref 8.9–10.3)
CO2: 29 mmol/L (ref 22–32)
CREATININE: 0.7 mg/dL (ref 0.61–1.24)
Chloride: 107 mmol/L (ref 101–111)
Glucose, Bld: 103 mg/dL — ABNORMAL HIGH (ref 65–99)
Potassium: 4 mmol/L (ref 3.5–5.1)
SODIUM: 141 mmol/L (ref 135–145)

## 2015-08-12 NOTE — Progress Notes (Signed)
PCP- Dr. Kenton Kingfisher, pt. Denies ever having any heart related problems.  Reports that PCP saw a "blip" on an ekg at one time so he has f/u'd with EKG periodically.  Pt.  Denies ever having stress test or a visit with a cardiologist.

## 2015-08-12 NOTE — Progress Notes (Signed)
Ph tech into see pt. For med. Reconciliation

## 2015-08-12 NOTE — Pre-Procedure Instructions (Signed)
[place]  08/12/2015      [id number] [address], [place] [address] Phone: [phone] Fax: [phone]  [id number] - [place], [place]. [address] [place] [address] Phone: [phone] Fax: [phone]    Your procedure is scheduled on 08/19/2015.  Report to [facility] Admitting at 5:30 A.M.  Call this number if you have problems the morning of surgery:  [phone]   Remember:  Do not eat food or drink liquids after midnight.  On Thursday  Take these medicines the morning of surgery with A SIP OF WATER : take Pepcid ( anti- reflux medicine ) the morning of surgery if needed    Do not wear jewelry   Do not wear lotions, powders, or perfumes.  You may wear deodorant.              Men may shave face and neck.   Do not bring valuables to the hospital.   [facility] is not responsible for any belongings or valuables.  Contacts, dentures or bridgework may not be worn into surgery.  Leave your suitcase in the car.  After surgery it may be brought to your room.  For patients admitted to the hospital, discharge time will be determined by your treatment team.  Patients discharged the day of surgery will not be allowed to drive home.   Name and phone number of your driver:   With spouse  Special instructions:  Special Instructions:  - Preparing for Surgery  Before surgery, you can play an important role.  Because skin is not sterile, your skin needs to be as free of germs as possible.  You can reduce the number of germs on you skin by washing with CHG (chlorahexidine gluconate) soap before surgery.  CHG is an antiseptic cleaner which kills germs and bonds with the skin to continue killing germs even after washing.  Please DO NOT use if you have an allergy to CHG or antibacterial soaps.  If your skin becomes  reddened/irritated stop using the CHG and inform your nurse when you arrive at Short Stay.  Do not shave (including legs and underarms) for at least 48 hours prior to the first CHG shower.  You may shave your face.  Please follow these instructions carefully:   1.  Shower with CHG Soap the night before surgery and the  morning of Surgery.  2.  If you choose to wash your hair, wash your hair first as usual with your  normal shampoo.  3.  After you shampoo, rinse your hair and body thoroughly to remove the  Shampoo.  4.  Use CHG as you would any other liquid soap.  You can apply chg directly to the skin and wash gently with scrungie or a clean washcloth.  5.  Apply the CHG Soap to your body ONLY FROM THE NECK DOWN.    Do not use on open wounds or open sores.  Avoid contact with your eyes, ears, mouth and genitals (private parts).  Wash genitals (private parts)   with your normal soap.  6.  Wash thoroughly, paying special attention to the area where your surgery will be performed.  7.  Thoroughly rinse your body with warm water from the neck down.  8.  DO NOT shower/wash with your normal soap after using and rinsing off   the CHG Soap.  9.  Pat yourself dry with a clean towel.            10.  Wear clean pajamas.            11.  Place clean sheets on your bed the night of your first shower and do not sleep with pets.  Day of Surgery  Do not apply any lotions/deodorants the morning of surgery.  Please wear clean clothes to the hospital/surgery center.  Please read over the following fact sheets that you were given. Pain Booklet, Coughing and Deep Breathing and Surgical Site Infection Prevention

## 2015-08-18 NOTE — H&P (Signed)
[place] 07/25/2015 9:05 AM Location: [facility] Patient #: [id number] DOB: 1941-04-03 Married / Language: English / Race: White Male   History of Present Illness (Tyrome Donatelli A. Ninfa Linden MD; 07/25/2015 9:20 AM) Patient words: New hernia.  The patient is a 74 year old male who presents with an inguinal hernia. This is a very pleasant gentleman referred by Dr. Shirline Frees for evaluation of a right inguinal hernia. The gentleman felt the hernia doing heavy lifting several months ago. He reports that it is getting larger. It did cause some mild burning discomfort which is now improved. He reports that otherwise easily reduces. He is otherwise without complaints and is healthy.   Other Problems Malachi Bonds, CMA; 07/25/2015 9:07 AM) Arthritis Back Pain Hypercholesterolemia Inguinal Hernia Melanoma Prostate Cancer  Past Surgical History Malachi Bonds, CMA; 07/25/2015 9:07 AM) Colon Polyp Removal - Colonoscopy Prostate Surgery - Removal  Diagnostic Studies History Malachi Bonds, CMA; 07/25/2015 9:07 AM) Colonoscopy 1-5 years ago  Allergies Malachi Bonds, CMA; 07/25/2015 9:06 AM) Meloxicam *ANALGESICS - ANTI-INFLAMMATORY*  Medication History (Chemira Jones, CMA; 07/25/2015 9:07 AM) Cialis (5MG  Tablet, Oral) Active. Aspirin (81MG  Tablet, Oral) Active. Multivitamin Adult (Oral) Active. Glucosamine Chondroitin Complx (Oral) Active. Red Yeast Rice Extract (600MG  Capsule, Oral) Active. Co Q 10 (100MG  Capsule, Oral) Active. Fish Oil (1200MG  Capsule, Oral) Active. Vitamin C (500MG  Tablet, Oral) Active. Medications Reconciled  Social History Malachi Bonds, CMA; 07/25/2015 9:07 AM) Alcohol use Moderate alcohol use. No caffeine use No drug use Tobacco use Former smoker.  Family History Malachi Bonds, CMA; 07/25/2015 9:07 AM) Cancer Father. Colon Cancer Mother. Seizure disorder Father.    Review of Systems Malachi Bonds CMA;  07/25/2015 9:07 AM) General Not Present- Appetite Loss, Chills, Fatigue, Fever, Night Sweats, Weight Gain and Weight Loss. Skin Not Present- Change in Wart/Mole, Dryness, Hives, Jaundice, New Lesions, Non-Healing Wounds, Rash and Ulcer. HEENT Present- Seasonal Allergies and Wears glasses/contact lenses. Not Present- Earache, Hearing Loss, Hoarseness, Nose Bleed, Oral Ulcers, Ringing in the Ears, Sinus Pain, Sore Throat, Visual Disturbances and Yellow Eyes. Respiratory Not Present- Bloody sputum, Chronic Cough, Difficulty Breathing, Snoring and Wheezing. Breast Not Present- Breast Mass, Breast Pain, Nipple Discharge and Skin Changes. Cardiovascular Present- Swelling of Extremities. Not Present- Chest Pain, Difficulty Breathing Lying Down, Leg Cramps, Palpitations, Rapid Heart Rate and Shortness of Breath. Gastrointestinal Not Present- Abdominal Pain, Bloating, Bloody Stool, Change in Bowel Habits, Chronic diarrhea, Constipation, Difficulty Swallowing, Excessive gas, Gets full quickly at meals, Hemorrhoids, Indigestion, Nausea, Rectal Pain and Vomiting. Male Genitourinary Present- Impotence. Not Present- Blood in Urine, Change in Urinary Stream, Frequency, Nocturia, Painful Urination, Urgency and Urine Leakage. Musculoskeletal Present- Back Pain, Joint Pain, Joint Stiffness and Swelling of Extremities. Not Present- Muscle Pain and Muscle Weakness. Neurological Not Present- Decreased Memory, Fainting, Headaches, Numbness, Seizures, Tingling, Tremor, Trouble walking and Weakness. Psychiatric Not Present- Anxiety, Bipolar, Change in Sleep Pattern, Depression, Fearful and Frequent crying. Endocrine Not Present- Cold Intolerance, Excessive Hunger, Hair Changes, Heat Intolerance, Hot flashes and New Diabetes. Hematology Present- Easy Bruising. Not Present- Excessive bleeding, Gland problems, HIV and Persistent Infections.  Vitals (Chemira Jones CMA; 07/25/2015 9:05 AM) 07/25/2015 9:05 AM Weight: 222.6 lb  Height: 71in Body Surface Area: 2.25 m Body Mass Index: 31.05 kg/m  Temp.: 98.11F(Oral)  BP: 112/74 (Sitting, Left Arm, Standard)     Physical Exam (Alin Chavira A. Ninfa Linden MD; 07/25/2015 9:20 AM) General Mental Status-Alert. General Appearance-Consistent with stated age. Hydration-Well hydrated. Voice-Normal.  Head and Neck Head-normocephalic, atraumatic with no  lesions or palpable masses. Trachea-midline.  Eye Eyeball - Bilateral-Extraocular movements intact. Sclera/Conjunctiva - Bilateral-No scleral icterus.  Chest and Lung Exam Chest and lung exam reveals -quiet, even and easy respiratory effort with no use of accessory muscles and on auscultation, normal breath sounds, no adventitious sounds and normal vocal resonance. Inspection Chest Wall - Normal. Back - normal.  Cardiovascular Cardiovascular examination reveals -normal heart sounds, regular rate and rhythm with no murmurs and normal pedal pulses bilaterally.  Abdomen Inspection Skin - Scar - no surgical scars. Hernias - Inguinal hernia - Right - Reducible. Note: This is a moderate size, easily reducible inguinal hernia. Palpation/Percussion Palpation and Percussion of the abdomen reveal - Soft, Non Tender, No Rebound tenderness, No Rigidity (guarding) and No hepatosplenomegaly. Auscultation Auscultation of the abdomen reveals - Bowel sounds normal.  Neurologic Neurologic evaluation reveals -alert and oriented x 3 with no impairment of recent or remote memory. Mental Status-Normal.  Musculoskeletal Normal Exam - Left-Upper Extremity Strength Normal and Lower Extremity Strength Normal. Normal Exam - Right-Upper Extremity Strength Normal, Lower Extremity Weakness.    Assessment & Plan (Morganne Haile A. Ninfa Linden MD; 07/25/2015 9:21 AM) RIGHT INGUINAL HERNIA (K40.90) Impression: I discussed the diagnosis with the patient and his wife in detail. I gave him a pamphlet regarding hernias.  I recommend open inguinal hernia repair with mesh. Because of his prior prostate surgery, I do not believe I can do this laparoscopically in the preperitoneal space. I discussed the surgical procedure in detail. I discussed the risks of surgery which includes but is not limited to bleeding, infection, nerve entrapment, chronic pain, recurrence, postoperative recovery, etc. He understands and wishes to proceed with surgery which will be scheduled Current Plans Pt Education - Pamphlet Given - Hernia Surgery: discussed with patient and provided information.

## 2015-08-19 ENCOUNTER — Ambulatory Visit (HOSPITAL_COMMUNITY): Payer: Federal, State, Local not specified - PPO | Admitting: Anesthesiology

## 2015-08-19 ENCOUNTER — Ambulatory Visit (HOSPITAL_COMMUNITY)
Admission: RE | Admit: 2015-08-19 | Discharge: 2015-08-19 | Disposition: A | Discharge status: Home / Self Care | Payer: Federal, State, Local not specified - PPO | Source: Ambulatory Visit | Attending: Surgery | Admitting: Surgery

## 2015-08-19 ENCOUNTER — Encounter (HOSPITAL_COMMUNITY): Payer: Self-pay | Admitting: *Deleted

## 2015-08-19 ENCOUNTER — Encounter (HOSPITAL_COMMUNITY)
Admission: RE | Disposition: A | Discharge status: Home / Self Care | Payer: Self-pay | Source: Ambulatory Visit | Attending: Surgery

## 2015-08-19 DIAGNOSIS — K4090 Unilateral inguinal hernia, without obstruction or gangrene, not specified as recurrent: Secondary | ICD-10-CM | POA: Insufficient documentation

## 2015-08-19 DIAGNOSIS — Z8546 Personal history of malignant neoplasm of prostate: Secondary | ICD-10-CM | POA: Insufficient documentation

## 2015-08-19 DIAGNOSIS — Z87891 Personal history of nicotine dependence: Secondary | ICD-10-CM | POA: Diagnosis not present

## 2015-08-19 HISTORY — PX: INGUINAL HERNIA REPAIR: SHX194

## 2015-08-19 HISTORY — PX: INSERTION OF MESH: SHX5868

## 2015-08-19 SURGERY — REPAIR, HERNIA, INGUINAL, ADULT
Anesthesia: Regional | Site: Groin | Laterality: Right

## 2015-08-19 MED ORDER — ONDANSETRON HCL 4 MG/2ML IJ SOLN
INTRAMUSCULAR | Status: DC | PRN
  Administered 2015-08-19: 4 mg via INTRAVENOUS

## 2015-08-19 MED ORDER — OXYCODONE HCL 5 MG PO TABS: 5.0000 mg | ORAL_TABLET | ORAL | Status: DC | PRN

## 2015-08-19 MED ORDER — MIDAZOLAM HCL 2 MG/2ML IJ SOLN
INTRAMUSCULAR | Status: AC
  Filled 2015-08-19: qty 4

## 2015-08-19 MED ORDER — ROCURONIUM BROMIDE 50 MG/5ML IV SOLN
INTRAVENOUS | Status: AC
  Filled 2015-08-19: qty 1

## 2015-08-19 MED ORDER — SODIUM CHLORIDE 0.9 % IJ SOLN
INTRAMUSCULAR | Status: AC
  Filled 2015-08-19: qty 10

## 2015-08-19 MED ORDER — FENTANYL CITRATE (PF) 100 MCG/2ML IJ SOLN
INTRAMUSCULAR | Status: DC | PRN
  Administered 2015-08-19 (×3): 50 ug via INTRAVENOUS

## 2015-08-19 MED ORDER — DEXAMETHASONE SODIUM PHOSPHATE 4 MG/ML IJ SOLN
INTRAMUSCULAR | Status: DC | PRN
  Administered 2015-08-19: 4 mg via INTRAVENOUS

## 2015-08-19 MED ORDER — BUPIVACAINE-EPINEPHRINE (PF) 0.5% -1:200000 IJ SOLN
INTRAMUSCULAR | Status: AC
  Filled 2015-08-19: qty 30

## 2015-08-19 MED ORDER — ONDANSETRON HCL 4 MG/2ML IJ SOLN
INTRAMUSCULAR | Status: AC
  Filled 2015-08-19: qty 2

## 2015-08-19 MED ORDER — HYDROCODONE-ACETAMINOPHEN 5-325 MG PO TABS: 1.0000 | ORAL_TABLET | ORAL | Status: DC | PRN

## 2015-08-19 MED ORDER — SUCCINYLCHOLINE CHLORIDE 20 MG/ML IJ SOLN
INTRAMUSCULAR | Status: AC
  Filled 2015-08-19: qty 1

## 2015-08-19 MED ORDER — ACETAMINOPHEN 325 MG PO TABS: 325.0000 mg | ORAL_TABLET | ORAL | Status: DC | PRN

## 2015-08-19 MED ORDER — BUPIVACAINE-EPINEPHRINE 0.5% -1:200000 IJ SOLN
INTRAMUSCULAR | Status: DC | PRN
  Administered 2015-08-19: 30 mL

## 2015-08-19 MED ORDER — MIDAZOLAM HCL 5 MG/5ML IJ SOLN
INTRAMUSCULAR | Status: DC | PRN
  Administered 2015-08-19: 2 mg via INTRAVENOUS

## 2015-08-19 MED ORDER — LIDOCAINE HCL (CARDIAC) 20 MG/ML IV SOLN
INTRAVENOUS | Status: DC | PRN
  Administered 2015-08-19: 60 mg via INTRAVENOUS

## 2015-08-19 MED ORDER — BUPIVACAINE-EPINEPHRINE (PF) 0.5% -1:200000 IJ SOLN
INTRAMUSCULAR | Status: DC | PRN
  Administered 2015-08-19: 30 mL via PERINEURAL

## 2015-08-19 MED ORDER — GLYCOPYRROLATE 0.2 MG/ML IJ SOLN
INTRAMUSCULAR | Status: AC
  Filled 2015-08-19: qty 1

## 2015-08-19 MED ORDER — FENTANYL CITRATE (PF) 250 MCG/5ML IJ SOLN
INTRAMUSCULAR | Status: AC
  Filled 2015-08-19: qty 5

## 2015-08-19 MED ORDER — OXYCODONE HCL 5 MG PO TABS: 5.0000 mg | ORAL_TABLET | Freq: Once | ORAL | Status: DC | PRN

## 2015-08-19 MED ORDER — CEFAZOLIN SODIUM-DEXTROSE 2-3 GM-% IV SOLR
INTRAVENOUS | Status: AC
  Administered 2015-08-19: 2 g via INTRAVENOUS
  Filled 2015-08-19: qty 50

## 2015-08-19 MED ORDER — ACETAMINOPHEN 160 MG/5ML PO SOLN
325.0000 mg | ORAL | Status: DC | PRN
  Filled 2015-08-19: qty 20.3

## 2015-08-19 MED ORDER — FENTANYL CITRATE (PF) 100 MCG/2ML IJ SOLN
INTRAMUSCULAR | Status: AC
  Filled 2015-08-19: qty 2

## 2015-08-19 MED ORDER — DEXAMETHASONE SODIUM PHOSPHATE 4 MG/ML IJ SOLN
INTRAMUSCULAR | Status: AC
  Filled 2015-08-19: qty 1

## 2015-08-19 MED ORDER — PHENYLEPHRINE 40 MCG/ML (10ML) SYRINGE FOR IV PUSH (FOR BLOOD PRESSURE SUPPORT)
PREFILLED_SYRINGE | INTRAVENOUS | Status: AC
  Filled 2015-08-19: qty 10

## 2015-08-19 MED ORDER — EPHEDRINE SULFATE 50 MG/ML IJ SOLN
INTRAMUSCULAR | Status: AC
  Filled 2015-08-19: qty 1

## 2015-08-19 MED ORDER — PROPOFOL 10 MG/ML IV BOLUS
INTRAVENOUS | Status: AC
  Filled 2015-08-19: qty 20

## 2015-08-19 MED ORDER — PROPOFOL 10 MG/ML IV BOLUS
INTRAVENOUS | Status: DC | PRN
  Administered 2015-08-19: 150 mg via INTRAVENOUS

## 2015-08-19 MED ORDER — 0.9 % SODIUM CHLORIDE (POUR BTL) OPTIME
TOPICAL | Status: DC | PRN
  Administered 2015-08-19: 1000 mL

## 2015-08-19 MED ORDER — FENTANYL CITRATE (PF) 100 MCG/2ML IJ SOLN
25.0000 ug | INTRAMUSCULAR | Status: DC | PRN
  Administered 2015-08-19: 25 ug via INTRAVENOUS

## 2015-08-19 MED ORDER — LIDOCAINE HCL (CARDIAC) 20 MG/ML IV SOLN
INTRAVENOUS | Status: AC
  Filled 2015-08-19: qty 5

## 2015-08-19 MED ORDER — LACTATED RINGERS IV SOLN
INTRAVENOUS | Status: DC | PRN
  Administered 2015-08-19 (×2): via INTRAVENOUS

## 2015-08-19 MED ORDER — OXYCODONE HCL 5 MG/5ML PO SOLN: 5.0000 mg | Freq: Once | ORAL | Status: DC | PRN

## 2015-08-19 SURGICAL SUPPLY — 43 items
BLADE SURG 10 STRL SS (BLADE) ×3 IMPLANT
BLADE SURG 15 STRL LF DISP TIS (BLADE) ×1 IMPLANT
BLADE SURG 15 STRL SS (BLADE) ×2
BLADE SURG ROTATE 9660 (MISCELLANEOUS) ×3 IMPLANT
CHLORAPREP W/TINT 26ML (MISCELLANEOUS) ×3 IMPLANT
COVER SURGICAL LIGHT HANDLE (MISCELLANEOUS) ×3 IMPLANT
DRAIN PENROSE 1/2X12 LTX STRL (WOUND CARE) ×3 IMPLANT
DRAPE LAPAROTOMY TRNSV 102X78 (DRAPE) ×3 IMPLANT
DRAPE UTILITY XL STRL (DRAPES) ×6 IMPLANT
ELECT CAUTERY BLADE 6.4 (BLADE) ×3 IMPLANT
ELECT REM PT RETURN 9FT ADLT (ELECTROSURGICAL) ×3
ELECTRODE REM PT RTRN 9FT ADLT (ELECTROSURGICAL) ×1 IMPLANT
GLOVE BIOGEL PI IND STRL 7.0 (GLOVE) ×2 IMPLANT
GLOVE BIOGEL PI IND STRL 7.5 (GLOVE) ×1 IMPLANT
GLOVE BIOGEL PI INDICATOR 7.0 (GLOVE) ×4
GLOVE BIOGEL PI INDICATOR 7.5 (GLOVE) ×2
GLOVE SURG SIGNA 7.5 PF LTX (GLOVE) ×3 IMPLANT
GLOVE SURG SS PI 7.0 STRL IVOR (GLOVE) ×3 IMPLANT
GOWN STRL REUS W/ TWL LRG LVL3 (GOWN DISPOSABLE) ×1 IMPLANT
GOWN STRL REUS W/ TWL XL LVL3 (GOWN DISPOSABLE) ×1 IMPLANT
GOWN STRL REUS W/TWL LRG LVL3 (GOWN DISPOSABLE) ×2
GOWN STRL REUS W/TWL XL LVL3 (GOWN DISPOSABLE) ×2
KIT BASIN OR (CUSTOM PROCEDURE TRAY) ×3 IMPLANT
KIT ROOM TURNOVER OR (KITS) ×3 IMPLANT
LIQUID BAND (GAUZE/BANDAGES/DRESSINGS) ×3 IMPLANT
MESH PARIETEX PROGRIP RIGHT (Mesh General) ×3 IMPLANT
NEEDLE HYPO 25GX1X1/2 BEV (NEEDLE) ×3 IMPLANT
NS IRRIG 1000ML POUR BTL (IV SOLUTION) ×3 IMPLANT
PACK SURGICAL SETUP 50X90 (CUSTOM PROCEDURE TRAY) ×3 IMPLANT
PAD ARMBOARD 7.5X6 YLW CONV (MISCELLANEOUS) ×3 IMPLANT
PENCIL BUTTON HOLSTER BLD 10FT (ELECTRODE) ×3 IMPLANT
SPONGE LAP 18X18 X RAY DECT (DISPOSABLE) ×3 IMPLANT
SUT MON AB 4-0 PC3 18 (SUTURE) ×3 IMPLANT
SUT SILK 2 0 SH (SUTURE) ×6 IMPLANT
SUT VIC AB 2-0 CT1 27 (SUTURE) ×4
SUT VIC AB 2-0 CT1 TAPERPNT 27 (SUTURE) ×2 IMPLANT
SUT VIC AB 3-0 CT1 27 (SUTURE) ×2
SUT VIC AB 3-0 CT1 TAPERPNT 27 (SUTURE) ×1 IMPLANT
SYR CONTROL 10ML LL (SYRINGE) ×3 IMPLANT
TOWEL OR 17X26 10 PK STRL BLUE (TOWEL DISPOSABLE) ×3 IMPLANT
TUBE CONNECTING 12'X1/4 (SUCTIONS) ×1
TUBE CONNECTING 12X1/4 (SUCTIONS) ×2 IMPLANT
YANKAUER SUCT BULB TIP NO VENT (SUCTIONS) ×3 IMPLANT

## 2015-08-19 NOTE — Op Note (Signed)
RIGHT INGUINAL HERNIA REPAIR, INSERTION OF MESH  Procedure Note  Ricci Dirocco. 08/19/2015   Pre-op Diagnosis: right inguinal hernia     Post-op Diagnosis: same  Procedure(s): RIGHT INGUINAL HERNIA REPAIR INSERTION OF MESH  Surgeon(s): [name], MD  Anesthesia: General  Staff:  Circulator: Gilberto Better, RN Scrub Person: Rolan Bucco Circulator Assistant: Ma Rings, RN  Estimated Blood Loss: Minimal                         Peightyn Roberson A   Date: 08/19/2015  Time: 8:20 AM

## 2015-08-19 NOTE — Anesthesia Postprocedure Evaluation (Signed)
  Anesthesia Post-op Note  Patient: Tanner Richardson.  Procedure(s) Performed: Procedure(s): RIGHT INGUINAL HERNIA REPAIR (Right) INSERTION OF MESH (Right)  Patient Location: PACU  Anesthesia Type:GA combined with regional for post-op pain  Level of Consciousness: awake  Airway and Oxygen Therapy: Patient Spontanous Breathing  Post-op Pain: mild  Post-op Assessment: Post-op Vital signs reviewed, Patient's Cardiovascular Status Stable, Respiratory Function Stable, Patent Airway, No signs of Nausea or vomiting and Pain level controlled              Post-op Vital Signs: Reviewed and stable  Last Vitals:  Filed Vitals:   08/19/15 0855  BP: 156/79  Pulse: 68  Temp:   Resp: 15    Complications: No apparent anesthesia complications

## 2015-08-19 NOTE — Discharge Instructions (Signed)
[facility]  UMBILICAL OR INGUINAL HERNIA REPAIR: POST OP INSTRUCTIONS  Always review your discharge instruction sheet given to you by the facility where your surgery was performed. IF YOU HAVE DISABILITY OR FAMILY LEAVE FORMS, YOU MUST BRING THEM TO THE OFFICE FOR PROCESSING.   DO NOT GIVE THEM TO YOUR DOCTOR.  1. A  prescription for pain medication may be given to you upon discharge.  Take your pain medication as prescribed, if needed.  If narcotic pain medicine is not needed, then you may take acetaminophen (Tylenol) or ibuprofen (Advil) as needed. 2. Take your usually prescribed medications unless otherwise directed. 3. If you need a refill on your pain medication, please contact your pharmacy.  They will contact our office to request authorization. Prescriptions will not be filled after 5 pm or on week-ends. 4. You should follow a light diet the first 24 hours after arrival home, such as soup and crackers, etc.  Be sure to include lots of fluids daily.  Resume your normal diet the day after surgery. 5. Most patients will experience some swelling and bruising around the umbilicus or in the groin and scrotum.  Ice packs and reclining will help.  Swelling and bruising can take several days to resolve.  6. It is common to experience some constipation if taking pain medication after surgery.  Increasing fluid intake and taking a stool softener (such as Colace) will usually help or prevent this problem from occurring.  A mild laxative (Milk of Magnesia or Miralax) should be taken according to package directions if there are no bowel movements after 48 hours. 7. Unless discharge instructions indicate otherwise, you may remove your bandages 24-48 hours after surgery, and you may shower at that time.  You may have steri-strips (small skin tapes) in place directly over the incision.  These strips should be left on the skin for 7-10 days.  If your surgeon used skin glue on the  incision, you may shower in 24 hours.  The glue will flake off over the next 2-3 weeks.  Any sutures or staples will be removed at the office during your follow-up visit. 8. ACTIVITIES:  You may resume regular (light) daily activities beginning the next day--such as daily self-care, walking, climbing stairs--gradually increasing activities as tolerated.  You may have sexual intercourse when it is comfortable.  Refrain from any heavy lifting or straining until approved by your doctor. a. You may drive when you are no longer taking prescription pain medication, you can comfortably wear a seatbelt, and you can safely maneuver your car and apply brakes. b. RETURN TO WORK:  __________________________________________________________ 9. You should see your doctor in the office for a follow-up appointment approximately 2-3 weeks after your surgery.  Make sure that you call for this appointment within a day or two after you arrive home to insure a convenient appointment time. 10. OTHER INSTRUCTIONS: NO LIFTING MORE THAN 15 TO 20 POUNDS FOR 4 WEEKS 11. Ice pack and ibuprofen also for pain 12. Stool softener for constipation __________________________________________________________________________________________________________________________________________________________________________________________  WHEN TO CALL YOUR DOCTOR: 1. Fever over 101.0 2. Inability to urinate 3. Nausea and/or vomiting 4. Extreme swelling or bruising 5. Continued bleeding from incision. 6. Increased pain, redness, or drainage from the incision  The clinic staff is available to answer your questions during regular business hours.  Please dont hesitate to call and ask to speak to one of the nurses for clinical concerns.  If you have a medical emergency, go to  the nearest emergency room or call 911.  A surgeon from [facility] is always on call at the hospital   [address], [place], [place], [place]   [address] ?  [address], [place], [place]   [address] [phone] ? [phone] ? FAX [phone] Web site: [email or web address]

## 2015-08-19 NOTE — Anesthesia Preprocedure Evaluation (Addendum)
Anesthesia Evaluation  Patient identified by MRN, date of birth, ID band Patient awake    Reviewed: Allergy & Precautions, NPO status , Patient's Chart, lab work & pertinent test results  History of Anesthesia Complications Negative for: history of anesthetic complications  Airway Mallampati: III  TM Distance: >3 FB Neck ROM: Full    Dental  (+) Teeth Intact, Dental Advisory Given   Pulmonary neg shortness of breath, neg sleep apnea, neg COPD, neg recent URI, former smoker,    breath sounds clear to auscultation       Cardiovascular negative cardio ROS   Rhythm:Regular     Neuro/Psych negative neurological ROS  negative psych ROS   GI/Hepatic Neg liver ROS, GERD  Medicated and Controlled,  Endo/Other  negative endocrine ROS  Renal/GU negative Renal ROS     Musculoskeletal  (+) Arthritis ,   Abdominal   Peds  Hematology negative hematology ROS (+)   Anesthesia Other Findings   Reproductive/Obstetrics                            Anesthesia Physical Anesthesia Plan  ASA: I  Anesthesia Plan: General and Regional   Post-op Pain Management:    Induction: Intravenous  Airway Management Planned: LMA and Oral ETT  Additional Equipment: None  Intra-op Plan:   Post-operative Plan: Extubation in OR  Informed Consent: I have reviewed the patients History and Physical, chart, labs and discussed the procedure including the risks, benefits and alternatives for the proposed anesthesia with the patient or authorized representative who has indicated his/her understanding and acceptance.   Dental advisory given  Plan Discussed with: CRNA and Surgeon  Anesthesia Plan Comments:         Anesthesia Quick Evaluation

## 2015-08-19 NOTE — Anesthesia Procedure Notes (Addendum)
Procedure Name: LMA Insertion Date/Time: 08/19/2015 7:25 AM Performed by: Luciana Axe K Pre-anesthesia Checklist: Patient identified, Emergency Drugs available, Suction available, Patient being monitored and Timeout performed Patient Re-evaluated:Patient Re-evaluated prior to inductionOxygen Delivery Method: Circle system utilized Preoxygenation: Pre-oxygenation with 100% oxygen Intubation Type: IV induction Ventilation: Mask ventilation without difficulty LMA: LMA inserted LMA Size: 5.0 Number of attempts: 1 Placement Confirmation: positive ETCO2,  CO2 detector and breath sounds checked- equal and bilateral Tube secured with: Tape   Anesthesia Regional Block:  TAP block  Pre-Anesthetic Checklist: ,, timeout performed, Correct Patient, Correct Site, Correct Laterality, Correct Procedure, Correct Position, risks and benefits discussed, surgical consent, pre-op evaluation,  At surgeon's request and post-op pain management  Laterality: Right  Prep: chloraprep       Needles:  Injection technique: Single-shot  Needle Type: Echogenic Needle          Additional Needles:  Procedures: ultrasound guided (picture in chart) TAP block Narrative:  Injection made incrementally with aspirations every 5 mL.  Performed by: Personally   Additional Notes: H+P and labs reviewed, risks and benefits discussed with patient, procedure tolerated well without complications

## 2015-08-19 NOTE — Transfer of Care (Signed)
Immediate Anesthesia Transfer of Care Note  Patient: Tanner Richardson.  Procedure(s) Performed: Procedure(s): RIGHT INGUINAL HERNIA REPAIR (Right) INSERTION OF MESH (Right)  Patient Location: PACU  Anesthesia Type:General  Level of Consciousness: awake, oriented and patient cooperative  Airway & Oxygen Therapy: Patient Spontanous Breathing and Patient connected to nasal cannula oxygen  Post-op Assessment: Report given to RN and Post -op Vital signs reviewed and stable  Post vital signs: Reviewed  Last Vitals:  Filed Vitals:   08/19/15 0840  BP: 157/73  Pulse: 70  Temp:   Resp: 15    Complications: No apparent anesthesia complications

## 2015-08-19 NOTE — Interval H&P Note (Signed)
History and Physical Interval Note:no change in H and P  08/19/2015 6:37 AM  Tanner Richardson.  has presented today for surgery, with the diagnosis of right inguinal hernia  The various methods of treatment have been discussed with the patient and family. After consideration of risks, benefits and other options for treatment, the patient has consented to  Procedure(s): RIGHT INGUINAL HERNIA REPAIR (Right) INSERTION OF MESH (Right) as Richardson surgical intervention .  The patient's history has been reviewed, patient examined, no change in status, stable for surgery.  I have reviewed the patient's chart and labs.  Questions were answered to the patient's satisfaction.     Tanner Richardson

## 2015-08-20 NOTE — Op Note (Signed)
NAME:  Tanner Richardson, Tanner Richardson NO.:  [id number]  MEDICAL RECORD NO.:  [id number]  LOCATION:  [facility]                         FACILITY:  [facility]  PHYSICIAN:  [name], M.D. DATE OF BIRTH:  05-Jun-1941  DATE OF PROCEDURE:  08/19/2015 DATE OF DISCHARGE:  08/19/2015                              OPERATIVE REPORT   PREOPERATIVE DIAGNOSIS:  Right inguinal hernia.  POSTOPERATIVE DIAGNOSIS:  Right inguinal hernia.  PROCEDURE:  Right inguinal hernia repair with mesh.  SURGEON:  [name], M.D.  ANESTHESIA:  General with 0.25% Marcaine and TAP block provided by anesthesia.  ESTIMATED BLOOD LOSS:  Minimal.  FINDINGS:  The patient was found to have a large indirect hernia as well as a weak inguinal floor.  This was repaired with a piece of large Proceed Prolene ProGrip mesh from [employer].  PROCEDURE IN DETAIL:  The patient was brought to the operating room, identified as [employer].  He was placed supine on the operating room table.  General anesthesia was induced.  A TAP block was performed by the anesthesiologist under ultrasound.  The patient's abdomen was then prepped and draped in usual sterile fashion.  I anesthetized skin with Marcaine and performed a longitudinal incision in the right inguinal area with a scalpel.  I took this down through Scarpa's fascia with electrocautery.  The external oblique fascia was then identified and opened towards the internal and external ring.  The testicular cord and structures were controlled with Penrose drain.  The patient had a large indirect hernia sac.  I was able to separate it from the cord structures, but it was evidently involved with several vessels which I had to tie of with 2-0 silk ties.  I was able to then reduce all contents back into the abdominal cavity and tied of the base of sac with 2-0 silk suture.  The patient's inguinal floor was also fairly weakened, and I reapproximated one area with a 2-0  Vicryl suture.  I then brought a piece of Proceed Prolene grip mesh onto the field.  I placed it along the pubic tubercle and brought around the cord structures.  I then placed underneath the external oblique fascia.  I then sutured it to the pubic tubercle with a 2-0 Vicryl suture.  Good coverage of the cord structures, internal ring, and floor appeared to be achieved.  At this point, I closed the external oblique fascia with running 2-0 Vicryl suture.  I anesthetized the fascia further with Marcaine.  I then closed subcutaneous tissue with interrupted 0 Vicryl sutures and closed the skin with a running 4-0 Monocryl.  Skin glue was then applied.  The patient tolerated the procedure well.  All the counts were correct at the end of the procedure.  The patient was then extubated in the operating room and taken in a stable condition to the recovery room.     [name], M.D.     DB/MEDQ  D:  08/19/2015  T:  08/19/2015  Job:  [id number]

## 2015-08-22 ENCOUNTER — Encounter (HOSPITAL_COMMUNITY): Payer: Self-pay | Admitting: Surgery

## 2015-09-07 ENCOUNTER — Encounter: Payer: Self-pay | Admitting: Radiation Oncology

## 2015-09-07 NOTE — Progress Notes (Signed)
GU Location of Tumor / Histology: adenocarcinoma of the prostate   If Prostate Cancer, Gleason Score is (4 + 3) and PSA is (3.97) pretreatment. June 22, 2015 PSA 0.18.  Tanner Richardson. was referred by his PCP, Samara Snide, to Dr. Vernie Shanks 08/10/13 for an elevated PSA. Robotic assisted laparoscopic radical prostatectomy was performed 11/09/13. Unfortunately, PSA continues to rise suggestive of biochemical recurrence.  Biopsies of prostate (if applicable) revealed:    Past/Anticipated interventions by urology, if any: radical prostatectomy and surveillance.  Past/Anticipated interventions by medical oncology, if any: no  Weight changes, if any: no  Bowel/Bladder complaints, if any: maintain good continence, gradual improvement of ED with Cialis. Pretreatment complaints were frequency, nocturia, urgency and incontinence.    Nausea/Vomiting, if any: no  Pain issues, if any:  no  SAFETY ISSUES:  Prior radiation? no  Pacemaker/ICD? no  Possible current pregnancy? no  Is the patient on methotrexate? no  Current Complaints / other details:  74 year old male. Married with two sons. AX: Meloxicam. Hx. Of malignant melanoma. Former smoker.

## 2015-09-26 ENCOUNTER — Telehealth: Payer: Self-pay | Admitting: Medical Oncology

## 2015-09-26 NOTE — Telephone Encounter (Signed)
Oncology Nurse Navigator Documentation  Oncology Nurse Navigator Flowsheets 09/26/2015  Referral date to RadOnc/MedOnc 09/21/2015  Navigator Encounter Type Introductory phone call  Barriers/Navigation Needs No barriers at this time  Time Spent with Patient 15   I called pt to introduce myself as the Prostate Nurse Navigator and the Coordinator of the Prostate [facility].  1. I confirmed with the patient he is aware of his referral to the clinic December 9,2016 arriving at 7:45am.  2. I discussed the format of the clinic and the physicians he will be seeing that day.  3. I discussed where the clinic is located and how to contact me.  4. I confirmed his address and informed him I would be mailing a packet of information and forms to be completed. I asked him to bring them with him the day of his appointment.   He voiced understanding of the above. I asked him to call me if he has any questions or concerns regarding his appointments or the forms he needs to complete.

## 2015-10-06 ENCOUNTER — Telehealth: Payer: Self-pay | Admitting: Medical Oncology

## 2015-10-06 NOTE — Telephone Encounter (Signed)
Left a message requesting a return call to confirm his appointment 10/07/15 for the Prostate [place].

## 2015-10-07 ENCOUNTER — Encounter: Payer: Self-pay | Admitting: Adult Health

## 2015-10-07 ENCOUNTER — Encounter: Payer: Self-pay | Admitting: Radiation Oncology

## 2015-10-07 ENCOUNTER — Ambulatory Visit
Admission: RE | Admit: 2015-10-07 | Discharge: 2015-10-07 | Disposition: A | Discharge status: Home / Self Care | Payer: Federal, State, Local not specified - PPO | Source: Ambulatory Visit | Attending: Radiation Oncology | Admitting: Radiation Oncology

## 2015-10-07 ENCOUNTER — Encounter: Payer: Self-pay | Admitting: Medical Oncology

## 2015-10-07 ENCOUNTER — Ambulatory Visit (HOSPITAL_BASED_OUTPATIENT_CLINIC_OR_DEPARTMENT_OTHER): Payer: Federal, State, Local not specified - PPO | Admitting: Oncology

## 2015-10-07 VITALS — BP 133/64 | HR 67 | Temp 97.7°F | Resp 16 | Ht 71.0 in | Wt 224.5 lb

## 2015-10-07 DIAGNOSIS — R911 Solitary pulmonary nodule: Secondary | ICD-10-CM | POA: Diagnosis not present

## 2015-10-07 DIAGNOSIS — C61 Malignant neoplasm of prostate: Secondary | ICD-10-CM | POA: Diagnosis not present

## 2015-10-07 HISTORY — DX: Unspecified malignant neoplasm of skin, unspecified: C44.90

## 2015-10-07 NOTE — Consult Note (Signed)
Chief Complaint  Prostate Cancer   Reason For Visit  Location of consult: [facility]   History of Present Illness  Tanner Richardson is a 74 year old with the following urologic history:  1) Prostate cancer: He is s/p treatment with a BNS RAL radical prostatectomy and BPLND on 11/09/13.  Diagnosis: pT3a N0 Mx, Gleason 4+3=7 adenocarcinoma of the prostate with negative surgical margins Pretreatment PSA: 3.97 Pretreatment SHIM: 24  Interval history:  He follows up today in the multidisciplinary clinic to further discuss his biochemically recurrent prostate cancer.  He remains symptom free and follows up today after his most recent PSA.  His PSA had increased to 0.18 at his last visit in August.     Past Medical History  1. History of Arthritis  2. History of hyperlipidemia (Z86.39)  3. History of Malignant Melanoma Of The Skin  4. History of Murmur (R01.1)  Surgical History  1. History of Excision Of Lesion Of Chest Wall  2. History of Laparoscopy With Bilateral Total Pelvic Lymphadenectomy  3. History of Prostatect Retropubic Radical W/ Nerve Sparing Laparoscopic  Current Meds  1. Acetaminophen CAPS; 2 qam & 2 pm for arthritis pain;  Therapy: (Recorded:13Nov2014) to Recorded  2. Arginine TABS;  Therapy: (Recorded:10Jul2015) to Recorded  3. Aspirin 81 MG TABS;  Therapy: (Recorded:13Oct2014) to Recorded  4. Cialis 5 MG Oral Tablet; TAKE 1 TABLET As Directed (Daily use tablets);  Therapy: [id number] to (Evaluate:15Jan2017)  Requested for: [id number]; Last  Rx:19Jul2016 Ordered  5. Daily Multiple Vitamins TABS;  Therapy: (Recorded:13Oct2014) to Recorded  6. Fish Oil CAPS;  Therapy: (Recorded:13Oct2014) to Recorded  7. Glucosamine-Chondroitin Oral Capsule;  Therapy: (Recorded:13Oct2014) to Recorded  8. Hydrocodone-Acetaminophen 5-325 MG Oral Tablet;  Therapy: [id number] to Recorded  9. Tylenol TABS;  Therapy: (Recorded:13Oct2014) to  Recorded  10. Vitamin C TABS;   Therapy: (Recorded:13Oct2014) to Recorded  Allergies  1. Meloxicam TABS  Family History  1. Family history of Brain Cancer : Father  2. Family history of Colon Cancer : Mother  3. Family history of Death In The Family Father : Father  52. Family history of Death In The Family Mother : Father  Social History   Alcohol Use (History)   Former smoker [phone])   Marital History - Currently Married   Occupation: Retired  Physical Exam Constitutional: Well nourished and well developed . No acute distress.    Results/Data Selected Results  PSA 02Dec2016 11:11AM [name]  SPECIMEN TYPE: BLOOD   Test Name Result Flag Reference  PSA 0.26 ng/mL  <=4.00  TEST METHODOLOGY: ECLIA PSA (ELECTROCHEMILUMINESCENCE IMMUNOASSAY)   PSA Flowsheet [Data Includes: All]   ** Printed in Appendix #1 below.    We have reviewed his pathology slides from his radical prostatectomy as well as his medical records, PSA results, and recent chest CT scan performed in September 2016.  Assessment  1. Adenocarcinoma of prostate (C61)  Discussion/Summary  1.  Biochemically recurrent prostate cancer: I had a detailed discussion with Tanner Richardson and his wife today.  We reviewed his recent PSA which does place him over the threshold definition for biochemically recurrent disease.  We had a long discussion regarding options for ongoing management taking into account his life expectancy and his priorities in life.  We discussed what options are proceeding with salvage local treatment with curative intent.  He understands that this will involve radiation therapy to the prostatic fossa probably with 6 months of androgen deprivation therapy.  We discussed  the potential risks and potential side effects of this therapy as well as the realistic success rate which he understands may be in the 30-50% range.  He understands that this approach, if successful, would obviate the need for  systemic therapy in the future.  We discussed the only way to know if this treatment is successful as to continue monitoring his PSA after therapy.  We also discussed the potential risks and benefits of concomitant androgen deprivation therapy with radiation as well as the potential side effects associated with this treatment.  We also discussed the alternative approach of proceeding with continued PSA surveillance with the option of utilizing systemic therapy in the future if/when he develops concern for development of measurable or symptomatic disease.  He understands that this would not be a curative approach and that this still could result in a risk of death from prostate cancer considering his excellent overall health currently.  However, he understands that this is a very reasonable approach as well considering his intermediate life expectancy and he understands that this still may prevent him from dying or suffering from prostate cancer related to metastatic disease.  Finally, we also discussed his recent chest CT scan that was performed by his primary care physician that suggested an indeterminate right-sided pulmonary nodule.  This was reviewed today in conference.  He understands that it would be highly unlikely that this would represent prostate cancer metastases.  However, he understands the need to proceed with follow-up imaging to determine whether there would be concern about malignancy particularly considering his history of melanoma.  We also discussed how a diagnosis of a lung malignancy with metastatic or primary might affect his decision to treat his prostate cancer.  All questions were answered to his stated satisfaction.  He appears to be leaning towards salvage local therapy with radiation therapy and concomitant androgen deprivation for 6 months.  He will notify me if he wishes to proceed with this treatment plan.  He is scheduled to meet with both Dr. Tammi Klippel and Dr. Alen Blew later this  morning.  Cc: Dr. Tyler Pita Dr. Zola Button Dr. Azalia Bilis   A total of 40 minutes were spent in the overall care of the patient today with 40 minutes in direct face to face consultation.    Signatures Electronically signed by : [name], M.D.; Oct 07 2015 11:53AM EST

## 2015-10-07 NOTE — Progress Notes (Signed)
    [facility] Psychosocial Distress Screening Counseling Intern  Counseling Intern was referred by distress screening protocol.  The patient scored a 6 on the Psychosocial Distress Thermometer which indicates Moderate distress. Cousneling intern Vaughan Sine and lead chaplain Lorrin Jackson  to assess for distress and other psychosocial needs.   ONCBCN DISTRESS SCREENING 10/07/2015  Screening Type Initial Screening  Distress experienced in past week (1-10) 6  Emotional problem type Adjusting to illness  Information Concerns Type Lack of info about diagnosis;Lack of info about treatment  Physical Problem type Sexual problems;Skin dry/itchy  Referral to support programs Yes   Counseling intern note: Met with Pt and wife Inez Catalina along with chaplain Lorrin Jackson during [facility].  Pt reported a level of 6 in distress at the time of our encounter but stated that his information concerns had been addressed during clinic. Pt and wife both stated that they are not prone to worrying and tend to "take things as they come."  Counseling intern and chaplain introduced and provided information about services available at the support center.    No specific support center follow up is indicated at this time and Pt verbalized that they would reach out as needed.   Vaughan Sine Counseling Intern  [phone]

## 2015-10-07 NOTE — Progress Notes (Signed)
                               Care Plan Summary  Name:  Tanner Richardson    DOB: 20-Mar-1941   Your Medical Team:   Urologist -  Dr. [name], [facility]  Radiation Oncologist - Dr. Tyler Pita, [facility]   Medical Oncologist - Dr. Zola Button, [place]  Recommendations: 1) Follow up CT in March for lung nodule- Dr. Kenton Kingfisher  2) Androgen Deprivation Therapy   3) Radiation  * These recommendations are based on information available as of today's consult.      Recommendations may change depending on the results of further tests or exams  Next Steps: 1)  Dr. Lynne Logan office will schedule Androgen Deprivation Injection 2) CT sim with Dr. Tammi Klippel 10/28/15 at 11:00 am  When appointments need to be scheduled, you will be contacted by [facility] and/or [facility].  Questions?  Please do not hesitate to call [name], RN, BSN, CRNI at [phone] any questions or concerns.  Shirlean Mylar is your Oncology Nurse Navigator and is available to assist you while you're receiving your medical care at [facility].

## 2015-10-07 NOTE — Progress Notes (Signed)
Radiation Oncology         [phone] ________________________________  Multidisciplinary Prostate Cancer Clinic  Initial Radiation Oncology Consultation  Name: Tanner Richardson. MRN: [id number]  Date: 10/07/2015   DOB: 1941/03/31  [id number], Tanner Saxon, MD  Tanner Bring, MD   REFERRING PHYSICIAN: [name], MD  DIAGNOSIS: 74 y.o. gentleman with a rising PSA of 0.26 s/p prostatectomy for stage T3a adenocarcinoma of the prostate with a Gleason's score of 4+3    ICD-9-CM ICD-10-CM   1. Prostate cancer [facility]) Tanner Richardson. is a 74 y.o. gentleman initially seen by Dr. Alinda Richardson in early 2015 for consult with persistently elevated PSA. Had a biopsy by Dr. Diona Richardson at that time. Radical prostatectomy 11/09/13. His pathology was as follows:     His PSA became detectable and rising most recently on December 2 was 0.26. He has a small right lower lung solitary pulmonary nodule warranting follow-up.  The patient reviewed the biopsy results with his urologist and he has kindly been referred today to the multidisciplinary prostate cancer clinic for presentation of pathology and radiology studies in our conference for discussion of potential radiation treatment options and clinical evaluation.  PREVIOUS RADIATION THERAPY: No  PAST MEDICAL HISTORY:  has a past medical history of History of skin cancer; GERD (gastroesophageal reflux disease); Prostate cancer ([place]); Arthritis; Dysrhythmia; and Skin cancer.    PAST SURGICAL HISTORY: Past Surgical History  Procedure Laterality Date  . Tonsillectomy    . Colonoscopy  2014    POLYP REMOVED  . Melanoma excision  2012    CHEST  . Robot assisted laparoscopic radical prostatectomy N/A 11/09/2013    Procedure: ROBOTIC ASSISTED LAPAROSCOPIC RADICAL PROSTATECTOMY LEVEL 2;  Surgeon: Tanner Gray, MD;  Location: [facility];  Service: Urology;  Laterality: N/A;  . Lymphadenectomy Bilateral 11/09/2013   Procedure: LYMPHADENECTOMY;  Surgeon: Tanner Gray, MD;  Location: [facility];  Service: Urology;  Laterality: Bilateral;  . Inguinal hernia repair Right 08/19/2015    Procedure: RIGHT INGUINAL HERNIA REPAIR;  Surgeon: Tanner Keens, MD;  Location: [place];  Service: General;  Laterality: Right;  . Insertion of mesh Right 08/19/2015    Procedure: INSERTION OF MESH;  Surgeon: Tanner Keens, MD;  Location: [place];  Service: General;  Laterality: Right;    FAMILY HISTORY: family history includes Cancer in his father, maternal aunt, maternal uncle, and mother.  SOCIAL HISTORY:  reports that he quit smoking about 46 years ago. His smoking use included Cigarettes. He has a 20 pack-year smoking history. He has never used smokeless tobacco. He reports that he drinks alcohol. He reports that he does not use illicit drugs.  ALLERGIES: Chicken allergy; Meloxicam; and Poultry meal  MEDICATIONS:  Current Outpatient Prescriptions  Medication Sig Dispense Refill  . acetaminophen (TYLENOL) 325 MG tablet Take 650 mg by mouth every 6 (six) hours as needed.    Marland Kitchen aspirin 81 MG tablet Take 81 mg by mouth daily.    Marland Kitchen CIALIS 5 MG tablet Take 1 tablet by mouth daily.  3  . Glucosamine-Chondroitin (GLUCOSAMINE CHONDR COMPLEX PO) Take 1 tablet by mouth daily.    . Multiple Vitamins-Minerals (MULTIVITAMIN WITH MINERALS) tablet Take 1 tablet by mouth daily.    . Omega-3 Fatty Acids (FISH OIL) 1200 MG CAPS Take 1 capsule by mouth daily.    Marland Kitchen OVER THE COUNTER MEDICATION Take 1-2 capsules by mouth 2 (two) times daily. 2 capsules in the morning, 1  capsule in the evening (L Arginine)    . Red Yeast Rice Extract 600 MG CAPS Take 1 capsule by mouth daily.    . sodium chloride (OCEAN) 0.65 % SOLN nasal spray Place 1 spray into both nostrils 2 (two) times daily as needed for congestion.    . vitamin C (ASCORBIC ACID) 500 MG tablet Take 500 mg by mouth daily.     No current facility-administered medications for this encounter.      REVIEW OF SYSTEMS:  A 15 point review of systems is documented in the electronic medical record. This was obtained by the nursing staff. However, I reviewed this with the patient to discuss relevant findings and make appropriate changes.  Pertinent items noted in HPI and remainder of comprehensive ROS otherwise negative..  The patient completed an IPSS and IIEF questionnaire.  His IPSS score was 6 indicating mild urinary outflow obstructive symptoms.  He indicated that his erectile function is unable to complete sexual activity.   74 year old male. Retired. Married to [employer]. Has one child, Tanner Richardson, who resides in [place] and is a Engineer, manufacturing systems. Reports his last colonoscopy was November 2014. Reports he does conduct regular testicular self exams. Wears glasses. Reports sinus problems and swelling feet. Reports he bruises easily and has arthritis. His wife would like to avoid treatment March 4th through March 10th, as they have a ski trip planned. They will also be out of town around the Christmas holiday.   PHYSICAL EXAM: This patient is in no acute distress.  He is alert and oriented.   height is 5\' 11"  (1.803 m) and weight is 224 lb 8 oz (101.833 kg). His oral temperature is 97.7 F (36.5 C). His blood pressure is 133/64 and his pulse is 67. His respiration is 16 and oxygen saturation is 100%.  He exhibits no respiratory distress or labored breathing.  He appears neurologically intact.  His mood is pleasant.  His affect is appropriate.  Please note the digital rectal exam findings described above.  KPS = 100  100 - Normal; no complaints; no evidence of disease. 90   - Able to carry on normal activity; minor signs or symptoms of disease. 80   - Normal activity with effort; some signs or symptoms of disease. 84   - Cares for self; unable to carry on normal activity or to do active work. 60   - Requires occasional assistance, but is able to care for most of his personal  needs. 50   - Requires considerable assistance and frequent medical care. 50   - Disabled; requires special care and assistance. 52   - Severely disabled; hospital admission is indicated although death not imminent. 24   - Very sick; hospital admission necessary; active supportive treatment necessary. 10   - Moribund; fatal processes progressing rapidly. 0     - Dead  Karnofsky DA, Abelmann [place], Craver LS and Burchenal [facility] [phone]) The use of the nitrogen mustards in the palliative treatment of carcinoma: with particular reference to bronchogenic carcinoma Cancer 1 634-56   LABORATORY DATA:  Lab Results  Component Value Date   WBC 3.9* 08/12/2015   HGB 14.3 08/12/2015   HCT 43.0 08/12/2015   MCV 98.9 08/12/2015   PLT 186 08/12/2015   Lab Results  Component Value Date   NA 141 08/12/2015   K 4.0 08/12/2015   CL 107 08/12/2015   CO2 29 08/12/2015   No results found for: ALT, AST, GGT, ALKPHOS, BILITOT  RADIOGRAPHY: No results found.    IMPRESSION: This gentleman is a 74 year-old gentleman with a rising PSA of 0.26 s/p prostatectomy for stage T3a adenocarcinoma of the prostate with a Gleason's score of 4+3.  Accordingly he may benefit from prostatic fossa radiotherapy.  PLAN: Today I reviewed the findings and workup thus far.  We discussed the natural history of prostate cancer.  We reviewed the the implications of T-stage, Gleason's Score, and PSA on decision-making and outcomes in prostate cancer.  We discussed radiation treatment in the management of prostate cancer with regard to the logistics and delivery of external beam radiation treatment as well as the logistics and delivery of prostate brachytherapy.    The patient would like to proceed with prostatic fossa IMRT and concurrent hormone therapy.  I will share my findings with Dr. Alinda Richardson and move forward with scheduling hormone therapy.  The patient has been scheduled for simulation and treatment planning on Friday, December  30th at 11 am.   We will investigate the solitary pulmonary nodule further with repeat CT imaging in 3 months.    I spent 60 minutes face to face with the patient and more than 50% of that time was spent in counseling and/or coordination of care.   ------------------------------------------------  Sheral Apley. Tammi Klippel, M.D.     This document serves as a record of services personally performed by Tyler Pita, MD. It was created on his behalf by Arlyce Harman, a trained medical scribe. The creation of this record is based on the scribe's personal observations and the provider's statements to them. This document has been checked and approved by the attending provider.

## 2015-10-07 NOTE — Consult Note (Signed)
Reason for Referral: Prostate cancer.   HPI: Tanner Richardson is a 74 year old gentleman currently of [place]. 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 He was found to have an elevated PSA up to 3.97 in late 2014. And subsequently underwent radical prostatectomy on January 2015. 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 Extra prostatic extension was noted. Lymph node sample did not show any evidence of disease. His PSA nadir was less than 0.01 and had slight increase up to 0.0 11/04/2014. In the last 12 months his PSA went up to 0.1 and 0.26 recently. He was referred to the prostate cancer multidisciplinary clinic for discussion. He is completely asymptomatic at this time and feels relatively well. He continues to ride horses but did have a hernia recently which have slowed him down. He has not reported any urinary complaints at this time.  He does not report any any dysuria or hematuria. Has not reported any constitutional symptoms. He does have a remote smoking history.  He does not report any headaches, blurry vision, syncope or seizures. Has not reported any fevers or chills or sweats. Has not put any cough or hemoptysis hematemesis. Does not report any nausea, vomiting or abdominal pain. Does not report any chest pain, palpitation orthopnea. Does not report any skeletal complaints. Remaining review of systems unremarkable.   Past Medical History  Diagnosis Date  . History of skin cancer   . GERD (gastroesophageal reflux disease)   . Prostate cancer [facility])     also- clear margins on melanoma - removed 2013  . Arthritis     hands, "joints"  . Dysrhythmia     pt. reports that Dr. Kenton Kingfisher (PCP) has done ekg's periodically for once seeing a "blip" on the tracing.   .  Skin cancer     melanoma  :  Past Surgical History  Procedure Laterality Date  . Tonsillectomy    . Colonoscopy  2014    POLYP REMOVED  . Melanoma excision  2012    CHEST  . Robot assisted laparoscopic radical prostatectomy N/A 11/09/2013    Procedure: ROBOTIC ASSISTED LAPAROSCOPIC RADICAL PROSTATECTOMY LEVEL 2;  Surgeon: [name], MD;  Location: [facility];  Service: Urology;  Laterality: N/A;  . Lymphadenectomy Bilateral 11/09/2013    Procedure: LYMPHADENECTOMY;  Surgeon: [name], MD;  Location: [facility];  Service: Urology;  Laterality: Bilateral;  . Inguinal hernia repair Right 08/19/2015    Procedure: RIGHT INGUINAL HERNIA REPAIR;  Surgeon: [name], MD;  Location: [place];  Service: General;  Laterality: Right;  . Insertion of mesh Right 08/19/2015    Procedure: INSERTION OF MESH;  Surgeon: [name], MD;  Location: [place];  Service: General;  Laterality: Right;  :   Current outpatient prescriptions:  .  acetaminophen (TYLENOL) 325 MG tablet, Take 650 mg by mouth every 6 (six) hours as needed., Disp: , Rfl:  .  aspirin 81 MG tablet, Take 81 mg by mouth daily., Disp: , Rfl:  .  CIALIS 5 MG tablet, Take 1 tablet by mouth daily., Disp: , Rfl: 3 .  Glucosamine-Chondroitin (GLUCOSAMINE CHONDR COMPLEX PO), Take 1 tablet by mouth daily., Disp: , Rfl:  .  Multiple Vitamins-Minerals (MULTIVITAMIN WITH MINERALS) tablet, Take 1 tablet by mouth daily., Disp: , Rfl:  .  Omega-3 Fatty Acids (FISH OIL) 1200 MG CAPS, Take 1 capsule by mouth daily., Disp: , Rfl:  .  OVER THE COUNTER MEDICATION, Take 1-2 capsules by mouth 2 (two) times daily. 2 capsules in the morning, 1 capsule in the evening (L Arginine), Disp: , Rfl:  .  Red Yeast Rice Extract 600 MG CAPS, Take 1 capsule by mouth daily., Disp: , Rfl:  .  sodium chloride (OCEAN) 0.65 % SOLN nasal spray, Place 1 spray into both nostrils 2 (two) times daily as needed for congestion., Disp: , Rfl:  .  vitamin C (ASCORBIC ACID) 500 MG  tablet, Take 500 mg by mouth daily., Disp: , Rfl: :  Allergies  Allergen Reactions  . Chicken Allergy Nausea And Vomiting  . Meloxicam Other (See Comments)    Blisters in the colon  . Poultry Meal Nausea And Vomiting  :  Family History  Problem Relation Age of Onset  . Cancer Mother     colon  . Cancer Father     brain  . Cancer Maternal Aunt     colon  . Cancer Maternal Uncle     lung  :  Social History   Social History  . Marital Status: Married    Spouse Name: N/A  . Number of Children: N/A  . Years of Education: N/A   Occupational History  . Not on file.   Social History Main Topics  . Smoking status: Former Smoker -- 2.00 packs/day for 10 years    Types: Cigarettes    Quit date: 11/04/1968  . Smokeless tobacco: Never Used  . Alcohol Use: Yes     Comment: OCCASIONAL- "1-2 drinks at night "   . Drug Use: No  . Sexual Activity: Yes   Other Topics Concern  . Not on file   Social History Narrative  :  Pertinent items are noted in HPI.  Exam: ECOG 0   General appearance: alert and cooperative. Without distress. Head: Normocephalic, without obvious abnormality Throat: lips, mucosa, and tongue normal; teeth and gums normal no oral ulcers or lesions. Neck: no adenopathy no thyromegaly. Back: negative no pain with percussion. Resp: clear to auscultation bilaterally breath sounds bilaterally without any wheezing or rhonchi. Chest wall: no tenderness cannot appreciate any masses or lesions on his right chest wall. Cardio: regular rate and rhythm, S1, S2 normal, no murmur, click, rub or gallop GI: soft, non-tender; bowel sounds normal; no masses,  no organomegaly Extremities: extremities normal, atraumatic, no cyanosis or edema Pulses: 2+ and symmetric Skin:  No rashes or lesions  CBC    Component Value Date/Time   WBC 3.9* 08/12/2015 1001   RBC 4.35 08/12/2015 1001   HGB 14.3 08/12/2015 1001   HCT 43.0 08/12/2015 1001   PLT 186 08/12/2015 1001   MCV  98.9 08/12/2015 1001   MCH 32.9 08/12/2015 1001   MCHC 33.3 08/12/2015 1001   RDW 13.4 08/12/2015 1001      Chemistry      Component Value Date/Time   NA 141 08/12/2015 1001   K 4.0 08/12/2015 1001   CL 107 08/12/2015 1001   CO2 29 08/12/2015 1001   BUN 15 08/12/2015 1001   CREATININE 0.70 08/12/2015 1001      Component Value Date/Time   CALCIUM 8.9 08/12/2015 1001     IMPRESSION: 1. CT confirms the presence of a small subpleural nodule at the right lung base. This is nonspecific and may be postinflammatory, although a metastasis cannot be completely excluded.  2. No other suspicious nodules demonstrated. 3. No adenopathy. 4. Given the patient's history of melanoma, Fleischner criteria do not apply. Chest CT follow-up in 6 months suggested.   Assessment and Plan:   74 year old gentleman with the following issues:  1. Prostate cancer diagnosed in 2014 with a PSA of 3.97. He subsequently underwent a prostatectomy in January 2015 and the final pathological staging was T3a N0 with a Gleason score 4+3 = 7. He had a undetectable PSA until recently with a slow rise in his PSA most recently up to 0.26. His case was discussed today the prostate cancer multidisciplinary clinic. Imaging studies and pathology results were reviewed with radiology and pathology respectively. Given the slow rise in his PSA and possible extraprostatic extension is reasonable to consider salvage radiation therapy given the rise in his PSA. I think his chances of  salvage is quite substantial at this time.  The recommendation is to proceed with that regardless of his pulmonary nodule.  2. Small subpleural nodule noted the right lung base: Differential diagnosis was discussed with the patient today and this likely represents a benign etiology. However. Etiologies cannot be ruled out completely. He does have some history of smoking and bronchogenic carcinoma is a possibility. Metastatic prostate cancer to the lung  is extremely unlikely given the low PSA and the appearance of this lesion. Metastatic melanoma with also be extremely unlikely given the fact that he had a rather melanoma in situ that has resected close to 5 years ago.  I agree with the current recommendation and have a repeat CT scan in 3 months to ensure stability.

## 2015-10-07 NOTE — Progress Notes (Signed)
Please see consult note.  

## 2015-10-07 NOTE — Addendum Note (Signed)
Encounter addended by: [name], MD on: 10/07/2015 12:48 PM<BR>     Documentation filed: Clinical Notes

## 2015-10-07 NOTE — Progress Notes (Addendum)
74 year old male. Retired. Married to [employer]. 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 Reports his last colonoscopy was November 2014. Reports he does conduct regular testicular self exams. Wears glasses. Reports sinus problems and swelling feet. Reports he bruises easily and has arthritis.

## 2015-10-07 NOTE — Progress Notes (Signed)
   Survivorship Program  Mr. Guilliams is a very pleasant 74 y.o. gentleman from [place], [place] with a diagnosis of prostate adenocarcinoma. He presents today with his wife to the Prostate Multi-Disciplinary Clinic (PMDC) for treatment consideration and recommendations from the urologist/surgeon, radiation oncologist, and medical oncologist.    I briefly met with Mr. Stribling and his wife during his PMDC visit today. We discussed the purpose of the Survivorship Clinic, which will include monitoring for recurrence, coordinating completion of age and gender-appropriate cancer screenings, promotion of overall wellness, as well as managing potential late/long-term side effects of anti-cancer treatments.     As of today, the intent of treatment for Mr. Mceachern is cure/local control.  Therefore he will be eligible for the Survivorship Clinic upon his completion of treatment.  His survivorship care plan (SCP) will be reviewed with him during an in-person visit with myself once he has completed treatment.    Mr. Puchalski was encouraged to ask questions and all questions were answered to his satisfaction.  He was given my business card and encouraged to contact me with any concerns regarding survivorship.  I look forward to participating in his care.    Gretchen Dawson, NP Survivorship Program [facility] [phone]       

## 2015-10-28 ENCOUNTER — Ambulatory Visit
Admission: RE | Admit: 2015-10-28 | Discharge: 2015-10-28 | Disposition: A | Discharge status: Home / Self Care | Payer: Federal, State, Local not specified - PPO | Source: Ambulatory Visit | Attending: Radiation Oncology | Admitting: Radiation Oncology

## 2015-10-28 DIAGNOSIS — C61 Malignant neoplasm of prostate: Secondary | ICD-10-CM

## 2015-10-28 NOTE — Progress Notes (Signed)
  Radiation Oncology         [phone]) [phone] ________________________________  Name: Tanner Richardson. MRN: [id number]  Date: 10/28/2015   DOB: 13-Aug-1941  SIMULATION AND TREATMENT PLANNING NOTE  No diagnosis found.  DIAGNOSIS:  74 y.o. 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  NARRATIVE:  The patient was brought to the [facility].  Identity was confirmed.  All relevant records and images related to the planned course of therapy were reviewed.  The patient freely provided informed written consent to proceed with treatment after reviewing the details related to the planned course of therapy. The consent form was witnessed and verified by the simulation staff.  Then, the patient was set-up in a stable reproducible supine position for radiation therapy.  A vacuum lock pillow device was custom fabricated to position his legs in a reproducible immobilized position.  Then, I performed a urethrogram under sterile conditions to identify the prostatic apex.  CT images were obtained.  Surface markings were placed.  The CT images were loaded into the planning software.  Then the prostate target and avoidance structures including the rectum, bladder, bowel and hips were contoured.  Treatment planning then occurred.  The radiation prescription was entered and confirmed.  A total of one complex treatment devices were fabricated. I have requested : Intensity Modulated Radiotherapy (IMRT) is medically necessary for this case for the following reason:  Rectal sparing.Marland Kitchen  PLAN:  The patient will receive 68.4 Gy in 38 fractions.  ________________________________  Sheral Apley Tammi Klippel, M.D.    This document serves as a record of services personally performed by Tyler Pita, MD. It was created on his behalf by Lendon Collar, a trained medical scribe. The creation of this record is based on the scribe's  personal observations and the provider's statements to them. This document has been checked and approved by the attending provider.

## 2015-11-02 ENCOUNTER — Telehealth: Payer: Self-pay | Admitting: Radiation Oncology

## 2015-11-02 NOTE — Telephone Encounter (Signed)
Patient left message requesting return call. Phoned patient back. Patient questions if staff is aware he has received androgen deprivation. Confirmed we are. Then, he questions if the androgen deprivation will negatively effect the absorption of radiation therapy. Confirmed it will not. Patient verbalized understanding and expressed appreciation for the return call.

## 2015-11-04 DIAGNOSIS — C61 Malignant neoplasm of prostate: Secondary | ICD-10-CM | POA: Insufficient documentation

## 2015-11-04 DIAGNOSIS — Z510 Encounter for antineoplastic radiation therapy: Secondary | ICD-10-CM | POA: Insufficient documentation

## 2015-11-07 DIAGNOSIS — Z510 Encounter for antineoplastic radiation therapy: Secondary | ICD-10-CM | POA: Diagnosis not present

## 2015-11-07 DIAGNOSIS — C61 Malignant neoplasm of prostate: Secondary | ICD-10-CM | POA: Diagnosis present

## 2015-11-09 ENCOUNTER — Ambulatory Visit
Admission: RE | Admit: 2015-11-09 | Discharge: 2015-11-09 | Disposition: A | Discharge status: Home / Self Care | Payer: Federal, State, Local not specified - PPO | Source: Ambulatory Visit | Attending: Radiation Oncology | Admitting: Radiation Oncology

## 2015-11-09 DIAGNOSIS — Z510 Encounter for antineoplastic radiation therapy: Secondary | ICD-10-CM | POA: Diagnosis not present

## 2015-11-10 ENCOUNTER — Ambulatory Visit
Admission: RE | Admit: 2015-11-10 | Discharge: 2015-11-10 | Disposition: A | Discharge status: Home / Self Care | Payer: Federal, State, Local not specified - PPO | Source: Ambulatory Visit | Attending: Radiation Oncology | Admitting: Radiation Oncology

## 2015-11-10 ENCOUNTER — Encounter: Payer: Self-pay | Admitting: Radiation Oncology

## 2015-11-10 VITALS — BP 125/64 | HR 71 | Resp 16 | Wt 224.7 lb

## 2015-11-10 DIAGNOSIS — C61 Malignant neoplasm of prostate: Secondary | ICD-10-CM

## 2015-11-10 DIAGNOSIS — Z510 Encounter for antineoplastic radiation therapy: Secondary | ICD-10-CM | POA: Diagnosis not present

## 2015-11-10 NOTE — Progress Notes (Signed)
  Radiation Oncology         [phone]   Name: Tanner Richardson. MRN: [id number]   Date: 11/10/2015  DOB: Mar 30, 1941   Weekly Radiation Therapy Management    ICD-9-CM ICD-10-CM   1. Prostate cancer ([place]) 185 C61     Current Dose: 3.6 Gy  Planned Dose:  68.4 Gy  Narrative The patient presents for routine under treatment assessment.  Weight and vitals stable. Denies pain. Denies fatigue. Reports urgency. Denies incontinence or leakage but, does report having an issue with this in the past. Denies diarrhea. Reports nocturia x 3. Describes a strong steady stream. Reports intermittent hot flashes.  The patient is without complaint. Set-up films were reviewed. The chart was checked.  Physical Findings  weight is 224 lb 11.2 oz (101.923 kg). His blood pressure is 125/64 and his pulse is 71. His respiration is 16 and oxygen saturation is 100%. . Weight essentially stable.  No significant changes.  Impression The patient is tolerating radiation.  Plan Continue treatment as planned.         Sheral Apley Tammi Klippel, M.D.    This document serves as a record of services personally performed by Tyler Pita, MD. It was created on his behalf by Lendon Collar, a trained medical scribe. The creation of this record is based on the scribe's personal observations and the provider's statements to them. This document has been checked and approved by the attending provider.

## 2015-11-10 NOTE — Progress Notes (Addendum)
Weight and vitals stable. Denies pain. Denies fatigue. Reports urgency. Denies incontinence or leakage but, does report having an issue with this in the past. Denies diarrhea. Reports nocturia x 3. Describes a strong steady stream. Reports intermittent hot flashes. Oriented patient to staff and routine of the clinic. Provided patient with RADIATION THERAPY AND YOU handbook then, reviewed pertinent information. Educated patient reference potential side effects and management such as fatigue, diarrhea, and urinary bladder changes. Provided patient with my business card and encouraged him to call with needs. Answered all patient questions to the best of my ability. Patient verbalized understanding of all reviewed.   BP 125/64 mmHg  Pulse 71  Resp 16  Wt 224 lb 11.2 oz (101.923 kg)  SpO2 100% Wt Readings from Last 3 Encounters:  11/10/15 224 lb 11.2 oz (101.923 kg)  10/07/15 224 lb 8 oz (101.833 kg)  08/19/15 223 lb 1.6 oz (101.197 kg)

## 2015-11-11 ENCOUNTER — Ambulatory Visit
Admission: RE | Admit: 2015-11-11 | Discharge: 2015-11-11 | Disposition: A | Discharge status: Home / Self Care | Payer: Federal, State, Local not specified - PPO | Source: Ambulatory Visit | Attending: Radiation Oncology | Admitting: Radiation Oncology

## 2015-11-11 DIAGNOSIS — Z510 Encounter for antineoplastic radiation therapy: Secondary | ICD-10-CM | POA: Diagnosis not present

## 2015-11-14 ENCOUNTER — Ambulatory Visit
Admission: RE | Admit: 2015-11-14 | Discharge: 2015-11-14 | Disposition: A | Discharge status: Home / Self Care | Payer: Federal, State, Local not specified - PPO | Source: Ambulatory Visit | Attending: Radiation Oncology | Admitting: Radiation Oncology

## 2015-11-14 DIAGNOSIS — Z510 Encounter for antineoplastic radiation therapy: Secondary | ICD-10-CM | POA: Diagnosis not present

## 2015-11-15 ENCOUNTER — Ambulatory Visit
Admission: RE | Admit: 2015-11-15 | Discharge: 2015-11-15 | Disposition: A | Discharge status: Home / Self Care | Payer: Federal, State, Local not specified - PPO | Source: Ambulatory Visit | Attending: Radiation Oncology | Admitting: Radiation Oncology

## 2015-11-15 DIAGNOSIS — Z510 Encounter for antineoplastic radiation therapy: Secondary | ICD-10-CM | POA: Diagnosis not present

## 2015-11-16 ENCOUNTER — Ambulatory Visit
Admission: RE | Admit: 2015-11-16 | Discharge: 2015-11-16 | Disposition: A | Discharge status: Home / Self Care | Payer: Federal, State, Local not specified - PPO | Source: Ambulatory Visit | Attending: Radiation Oncology | Admitting: Radiation Oncology

## 2015-11-16 ENCOUNTER — Encounter: Payer: Self-pay | Admitting: Medical Oncology

## 2015-11-16 DIAGNOSIS — Z510 Encounter for antineoplastic radiation therapy: Secondary | ICD-10-CM | POA: Diagnosis not present

## 2015-11-17 ENCOUNTER — Ambulatory Visit
Admission: RE | Admit: 2015-11-17 | Discharge: 2015-11-17 | Disposition: A | Discharge status: Home / Self Care | Payer: Federal, State, Local not specified - PPO | Source: Ambulatory Visit | Attending: Radiation Oncology | Admitting: Radiation Oncology

## 2015-11-17 ENCOUNTER — Encounter: Payer: Self-pay | Admitting: Radiation Oncology

## 2015-11-17 VITALS — BP 128/74 | HR 65 | Resp 16 | Wt 220.9 lb

## 2015-11-17 DIAGNOSIS — Z510 Encounter for antineoplastic radiation therapy: Secondary | ICD-10-CM | POA: Diagnosis not present

## 2015-11-17 DIAGNOSIS — C61 Malignant neoplasm of prostate: Secondary | ICD-10-CM

## 2015-11-17 NOTE — Progress Notes (Signed)
Weight and vitals stable. Denies pain. Denies fatigue. Denies dysuria or hematuria. Reports urgency continues. Denies incontinence or leakage at this time. Reports nocturia x 2. Denies diarrhea. Reports difficulty balancing bladder and bowel in preparation for treatment.   BP 128/74 mmHg  Pulse 65  Resp 16  Wt 220 lb 14.4 oz (100.2 kg)  SpO2 100% Wt Readings from Last 3 Encounters:  11/17/15 220 lb 14.4 oz (100.2 kg)  11/10/15 224 lb 11.2 oz (101.923 kg)  10/07/15 224 lb 8 oz (101.833 kg)

## 2015-11-17 NOTE — Progress Notes (Signed)
  Radiation Oncology         [id number]   Name: Tanner Richardson. MRN: [id number]   Date: 11/17/2015  DOB: Jan 05, 1941   Weekly Radiation Therapy Management    ICD-9-CM ICD-10-CM   1. Prostate cancer ([place]) 185 C61     Current Dose: 12.6 Gy  Planned Dose:  68.4 Gy  Narrative The patient presents for routine under treatment assessment.  Weight and vitals stable. Denies pain. Denies fatigue. Denies dysuria or hematuria. Reports urgency continues. Denies incontinence and leakage. Reports nocturia x 2. Denies diarrhea. Reports difficulty balancing bladder and bowel in preparation for treatment.    The patient is without complaint. Set-up films were reviewed. The chart was checked.  Physical Findings  weight is 220 lb 14.4 oz (100.2 kg). His blood pressure is 128/74 and his pulse is 65. His respiration is 16 and oxygen saturation is 100%. . Weight essentially stable.  No significant changes.  Impression The patient is tolerating radiation.  Plan Continue treatment as planned.      Sheral Apley Tammi Klippel, M.D.   This document serves as a record of services personally performed by Tyler Pita, MD. It was created on his behalf by Jenell Milliner, a trained medical scribe. The creation of this record is based on the scribe's personal observations and the provider's statements to them. This document has been checked and approved by the attending provider.

## 2015-11-18 ENCOUNTER — Ambulatory Visit
Admission: RE | Admit: 2015-11-18 | Discharge: 2015-11-18 | Disposition: A | Discharge status: Home / Self Care | Payer: Federal, State, Local not specified - PPO | Source: Ambulatory Visit | Attending: Radiation Oncology | Admitting: Radiation Oncology

## 2015-11-18 DIAGNOSIS — Z510 Encounter for antineoplastic radiation therapy: Secondary | ICD-10-CM | POA: Diagnosis not present

## 2015-11-18 DIAGNOSIS — C61 Malignant neoplasm of prostate: Secondary | ICD-10-CM

## 2015-11-18 NOTE — Progress Notes (Signed)
Patient presented to the clinic today requesting to be evaluated by the nurse. Patient reports he leaked on the table for a second time this week. Reports lack of control when his bladder is full. Normalized patient's feelings. Explained the importance of having a full bladder for treatment. Encouraged patient to not become frustrated explaining this happens often. Patient expresses concern about "rash on low back." Contact dermatitis noted on patient's anterior and posterior trunk. Dermatitis not noted on his arms or legs. Denies being aware of any change in soaps, detergents, etc. Patient assessed by Shona Simpson, PA and instructed to apply hydrocortisone 1%. Patient understands this RN will assess his skin during PUT next week for improvement. Lastly, patient questions if Dr. Tammi Klippel needed to make any changes to his treatment field based on his scans. Will forward this question onto Dr. Tammi Klippel and contact patient with his response.

## 2015-11-18 NOTE — Progress Notes (Signed)
  Radiation Oncology         [phone]   Name: Tanner Richardson. MRN: [id number]   Date: 11/18/2015  DOB: 1941-07-01   Weekly Radiation Therapy Management    ICD-9-CM ICD-10-CM   1. Prostate cancer ([place]) 185 C61     Current Dose: 14.4 Gy  Planned Dose:  68.4 Gy  Narrative The patient presents for a work in visit. Apparently yesterday evening the patient's wife noticed that he had a rash on his thorax. He states that he is unsure of any recent changes in the detergents that they may be using further laundry, but states he has not used any new soaps, creams, or worn any clothing that has not already been wash that would be new. He denies any pain of the sites but states that he's had some itching. He denies any fevers or chills or viral-like illness. No other complaints or verbalized.   Physical Findings  vitals were not taken for this visit.  There is a diffuse maculopapular rash noted in the lower have of the anterior and posterior thorax but not extending to the extremities. Superinfection is identified.   Impression The patient is tolerating radiation, and appears to have a rash consistent with contact dermatitis.   Plan  the patient will continue his treatments, and in the meantime over the weekend, use hydrocortisone cream over-the-counter twice daily to the thorax. We also discussed topical hydration with over-the-counter skin lotion. The patient will use something sitting to continue to wash and moisturize with slight amount of product or Aveeno product. We will continue to follow this expect only however he is encouraged to call if he has any increasing symptoms. I discussed with him that I did not feel he would benefit at this point from a steroid dose pack as his findings seem to be limited despite the location.      Carola Rhine, PAC

## 2015-11-21 ENCOUNTER — Ambulatory Visit
Admission: RE | Admit: 2015-11-21 | Discharge: 2015-11-21 | Disposition: A | Discharge status: Home / Self Care | Payer: Federal, State, Local not specified - PPO | Source: Ambulatory Visit | Attending: Radiation Oncology | Admitting: Radiation Oncology

## 2015-11-21 DIAGNOSIS — Z510 Encounter for antineoplastic radiation therapy: Secondary | ICD-10-CM | POA: Diagnosis not present

## 2015-11-22 ENCOUNTER — Encounter: Payer: Self-pay | Admitting: Medical Oncology

## 2015-11-22 ENCOUNTER — Ambulatory Visit
Admission: RE | Admit: 2015-11-22 | Discharge: 2015-11-22 | Disposition: A | Discharge status: Home / Self Care | Payer: Federal, State, Local not specified - PPO | Source: Ambulatory Visit | Attending: Radiation Oncology | Admitting: Radiation Oncology

## 2015-11-22 DIAGNOSIS — Z510 Encounter for antineoplastic radiation therapy: Secondary | ICD-10-CM | POA: Diagnosis not present

## 2015-11-23 ENCOUNTER — Ambulatory Visit
Admission: RE | Admit: 2015-11-23 | Discharge: 2015-11-23 | Disposition: A | Discharge status: Home / Self Care | Payer: Federal, State, Local not specified - PPO | Source: Ambulatory Visit | Attending: Radiation Oncology | Admitting: Radiation Oncology

## 2015-11-23 DIAGNOSIS — Z510 Encounter for antineoplastic radiation therapy: Secondary | ICD-10-CM | POA: Diagnosis not present

## 2015-11-24 ENCOUNTER — Ambulatory Visit
Admission: RE | Admit: 2015-11-24 | Discharge: 2015-11-24 | Disposition: A | Discharge status: Home / Self Care | Payer: Federal, State, Local not specified - PPO | Source: Ambulatory Visit | Attending: Radiation Oncology | Admitting: Radiation Oncology

## 2015-11-24 VITALS — BP 119/65 | HR 66 | Resp 16 | Wt 224.0 lb

## 2015-11-24 DIAGNOSIS — Z510 Encounter for antineoplastic radiation therapy: Secondary | ICD-10-CM | POA: Diagnosis not present

## 2015-11-24 DIAGNOSIS — C61 Malignant neoplasm of prostate: Secondary | ICD-10-CM

## 2015-11-24 NOTE — Progress Notes (Signed)
Weight and vitals stable. Denies pain. Reports dermatitis has improved. Reports dermatitis is no longer itchy thus, he has stopped hydrocortisone. Reports nocturia x 2-3. Denies dysuria or hematuria. Reports leakage continues and he has returned to wearing a pad. Denies diarrhea or rectal irritation. Denies fatigue.   BP 119/65 mmHg  Pulse 66  Resp 16  Wt 224 lb (101.606 kg) Wt Readings from Last 3 Encounters:  11/24/15 224 lb (101.606 kg)  11/17/15 220 lb 14.4 oz (100.2 kg)  11/10/15 224 lb 11.2 oz (101.923 kg)

## 2015-11-24 NOTE — Progress Notes (Signed)
  Radiation Oncology         [phone]   Name: Tanner Richardson. MRN: [id number]   Date: 11/24/2015  DOB: 11/02/1940   Weekly Radiation Therapy Management    ICD-9-CM ICD-10-CM   1. Prostate cancer ([place]) 185 C61     Current Dose: 21.6 Gy  Planned Dose:  68.4 Gy  Narrative The patient presents for routine under treatment assessment.  Weight and vitals stable. Denies pain. Reports dermatitis has improved. Reports dermatitis is no longer itchy thus, he has stopped hydrocortisone. Reports nocturia x 2-3. Denies dysuria or hematuria. Reports leakage continues and he has returned to wearing a pad. Denies diarrhea or rectal irritation. Denies fatigue.   The patient is without complaint. Set-up films were reviewed. The chart was checked.  Physical Findings  weight is 224 lb (101.606 kg). His blood pressure is 119/65 and his pulse is 66. His respiration is 16. . Weight essentially stable.  No significant changes.  Impression The patient is tolerating radiation.  Plan Continue treatment as planned.         Sheral Apley Tammi Klippel, M.D.  This document serves as a record of services personally performed by Tyler Pita, MD. It was created on his behalf by Jenell Milliner, a trained medical scribe. The creation of this record is based on the scribe's personal observations and the provider's statements to them. This document has been checked and approved by the attending provider.

## 2015-11-25 ENCOUNTER — Ambulatory Visit
Admission: RE | Admit: 2015-11-25 | Discharge: 2015-11-25 | Disposition: A | Discharge status: Home / Self Care | Payer: Federal, State, Local not specified - PPO | Source: Ambulatory Visit | Attending: Radiation Oncology | Admitting: Radiation Oncology

## 2015-11-25 DIAGNOSIS — Z510 Encounter for antineoplastic radiation therapy: Secondary | ICD-10-CM | POA: Diagnosis not present

## 2015-11-28 ENCOUNTER — Ambulatory Visit
Admission: RE | Admit: 2015-11-28 | Discharge: 2015-11-28 | Disposition: A | Discharge status: Home / Self Care | Payer: Federal, State, Local not specified - PPO | Source: Ambulatory Visit | Attending: Radiation Oncology | Admitting: Radiation Oncology

## 2015-11-28 DIAGNOSIS — Z510 Encounter for antineoplastic radiation therapy: Secondary | ICD-10-CM | POA: Diagnosis not present

## 2015-11-29 ENCOUNTER — Ambulatory Visit
Admission: RE | Admit: 2015-11-29 | Discharge: 2015-11-29 | Disposition: A | Discharge status: Home / Self Care | Payer: Federal, State, Local not specified - PPO | Source: Ambulatory Visit | Attending: Radiation Oncology | Admitting: Radiation Oncology

## 2015-11-29 DIAGNOSIS — Z510 Encounter for antineoplastic radiation therapy: Secondary | ICD-10-CM | POA: Diagnosis not present

## 2015-11-30 ENCOUNTER — Ambulatory Visit
Admission: RE | Admit: 2015-11-30 | Discharge: 2015-11-30 | Disposition: A | Discharge status: Home / Self Care | Payer: Federal, State, Local not specified - PPO | Source: Ambulatory Visit | Attending: Radiation Oncology | Admitting: Radiation Oncology

## 2015-11-30 DIAGNOSIS — Z510 Encounter for antineoplastic radiation therapy: Secondary | ICD-10-CM | POA: Diagnosis not present

## 2015-12-01 ENCOUNTER — Ambulatory Visit
Admission: RE | Admit: 2015-12-01 | Discharge: 2015-12-01 | Disposition: A | Discharge status: Home / Self Care | Payer: Federal, State, Local not specified - PPO | Source: Ambulatory Visit | Attending: Radiation Oncology | Admitting: Radiation Oncology

## 2015-12-01 DIAGNOSIS — Z510 Encounter for antineoplastic radiation therapy: Secondary | ICD-10-CM | POA: Diagnosis not present

## 2015-12-02 ENCOUNTER — Ambulatory Visit
Admission: RE | Admit: 2015-12-02 | Discharge: 2015-12-02 | Disposition: A | Discharge status: Home / Self Care | Payer: Federal, State, Local not specified - PPO | Source: Ambulatory Visit | Attending: Radiation Oncology | Admitting: Radiation Oncology

## 2015-12-02 VITALS — BP 131/66 | HR 64 | Resp 16 | Wt 223.9 lb

## 2015-12-02 DIAGNOSIS — Z510 Encounter for antineoplastic radiation therapy: Secondary | ICD-10-CM | POA: Diagnosis not present

## 2015-12-02 DIAGNOSIS — C61 Malignant neoplasm of prostate: Secondary | ICD-10-CM

## 2015-12-02 NOTE — Progress Notes (Signed)
  Radiation Oncology         [phone]   Name: Tanner Richardson. MRN: [id number]   Date: 12/02/2015  DOB: 1940-11-12     Weekly Radiation Therapy Management    ICD-9-CM ICD-10-CM   1. Prostate cancer ([place]) 185 C61     Current Dose: 32.4 Gy  Planned Dose:  68.4 Gy  Narrative The patient presents for routine under treatment assessment.  Weight and vitals stable. Denies pain. Better control these last few days with a full bladder. Reports nocturia x 2-4. Reports new onset dysuria. Denies hematuria. Reports leakage continues and he wears a pad for protection. Denies diarrhea or rectal irritation. Denies fatigue. Reports dermatitis is slowly improving.  The patient is without complaint. Set-up films were reviewed. The chart was checked.  Physical Findings  weight is 223 lb 14.4 oz (101.56 kg). His blood pressure is 131/66 and his pulse is 64. His respiration is 16 and oxygen saturation is 100%. . Weight essentially stable.  No significant changes.  Impression The patient is tolerating radiation.  Plan Continue treatment as planned.         Sheral Apley Tammi Klippel, M.D.  This document serves as a record of services personally performed by Tyler Pita, MD. It was created on his behalf by Arlyce Harman, a trained medical scribe. The creation of this record is based on the scribe's personal observations and the provider's statements to them. This document has been checked and approved by the attending provider.

## 2015-12-02 NOTE — Progress Notes (Signed)
Weight and vitals stable. Denies pain. Better control these last few days with a full bladder. Reports nocturia x 2-4. Reports new onset dysuria. Denies hematuria. Reports leakage continues and he wears a pad for protection. Denies diarrhea or rectal irritation. Denies fatigue. Reports dermatitis is slowly improving.   BP 131/66 mmHg  Pulse 64  Resp 16  Wt 223 lb 14.4 oz (101.56 kg)  SpO2 100% Wt Readings from Last 3 Encounters:  12/02/15 223 lb 14.4 oz (101.56 kg)  11/24/15 224 lb (101.606 kg)  11/17/15 220 lb 14.4 oz (100.2 kg)

## 2015-12-05 ENCOUNTER — Ambulatory Visit
Admission: RE | Admit: 2015-12-05 | Discharge: 2015-12-05 | Disposition: A | Discharge status: Home / Self Care | Payer: Federal, State, Local not specified - PPO | Source: Ambulatory Visit | Attending: Radiation Oncology | Admitting: Radiation Oncology

## 2015-12-05 DIAGNOSIS — Z510 Encounter for antineoplastic radiation therapy: Secondary | ICD-10-CM | POA: Diagnosis not present

## 2015-12-06 ENCOUNTER — Ambulatory Visit
Admission: RE | Admit: 2015-12-06 | Discharge: 2015-12-06 | Disposition: A | Discharge status: Home / Self Care | Payer: Federal, State, Local not specified - PPO | Source: Ambulatory Visit | Attending: Radiation Oncology | Admitting: Radiation Oncology

## 2015-12-06 DIAGNOSIS — Z510 Encounter for antineoplastic radiation therapy: Secondary | ICD-10-CM | POA: Diagnosis not present

## 2015-12-07 ENCOUNTER — Ambulatory Visit
Admission: RE | Admit: 2015-12-07 | Discharge: 2015-12-07 | Disposition: A | Discharge status: Home / Self Care | Payer: Federal, State, Local not specified - PPO | Source: Ambulatory Visit | Attending: Radiation Oncology | Admitting: Radiation Oncology

## 2015-12-07 DIAGNOSIS — Z510 Encounter for antineoplastic radiation therapy: Secondary | ICD-10-CM | POA: Diagnosis not present

## 2015-12-08 ENCOUNTER — Encounter: Payer: Self-pay | Admitting: Medical Oncology

## 2015-12-08 ENCOUNTER — Ambulatory Visit
Admission: RE | Admit: 2015-12-08 | Discharge: 2015-12-08 | Disposition: A | Discharge status: Home / Self Care | Payer: Federal, State, Local not specified - PPO | Source: Ambulatory Visit | Attending: Radiation Oncology | Admitting: Radiation Oncology

## 2015-12-08 ENCOUNTER — Encounter: Payer: Self-pay | Admitting: Radiation Oncology

## 2015-12-08 VITALS — BP 160/73 | HR 73 | Temp 98.2°F | Resp 20 | Wt 221.7 lb

## 2015-12-08 DIAGNOSIS — Z510 Encounter for antineoplastic radiation therapy: Secondary | ICD-10-CM | POA: Diagnosis not present

## 2015-12-08 DIAGNOSIS — C61 Malignant neoplasm of prostate: Secondary | ICD-10-CM

## 2015-12-08 NOTE — Progress Notes (Signed)
Weekly rad  tx prostate 22/38 completed, nocturia 3-4x now, good stream, slight dysuria when first starting flow, no hematuria , when getting scanned or rad txs he loses control of his  Bladder,   And leaks,  regular bowels,  10:00 AM BP 160/73 mmHg  Pulse 73  Temp(Src) 98.2 F (36.8 C) (Oral)  Resp 20  Wt 221 lb 11.2 oz (100.562 kg)  Wt Readings from Last 3 Encounters:  12/08/15 221 lb 11.2 oz (100.562 kg)  12/02/15 223 lb 14.4 oz (101.56 kg)  11/24/15 224 lb (101.606 kg)

## 2015-12-08 NOTE — Progress Notes (Signed)
Oncology Nurse Navigator Documentation  Oncology Nurse Navigator Flowsheets 11/16/2015 11/22/2015 12/08/2015  Navigator Location [facility] - -  Navigator Encounter Type Treatment Treatment Treatment  Treatment Phase Treatment Treatment Treatment- Tolerating treatments well. Is having issues with bladder leakage during this radiation treatments. He will discuss with Dr. Tammi Klippel during his week follow up today.  Barriers/Navigation Needs No barriers at this time No barriers at this time No Questions  Interventions None required None required None required  Support Groups/Services Friends and Family Friends and Family Friends and Family  Acuity Level 1 Level 1 Level 1  Acuity Level 1 Initial guidance, education and coordination as needed Minimal follow up required Minimal follow up required  Time Spent with Patient 15 15 -

## 2015-12-08 NOTE — Progress Notes (Signed)
  Radiation Oncology         [phone]   Name: Tanner Richardson. MRN: [id number]   Date: 12/08/2015  DOB: 1941-01-03     Weekly Radiation Therapy Management    ICD-9-CM ICD-10-CM   1. Prostate cancer ([place]) 185 C61     Current Dose: 39.6 Gy  Planned Dose:  68.4 Gy  Narrative The patient presents for routine under treatment assessment.  Weekly rad tx prostate 22/38 completed, nocturia 3-4x now, good stream, slight dysuria when first starting flow, no hematuria, when getting scanned or rad txs he loses control of his bladder, and leaks, regular bowels.   The patient is without complaint. Set-up films were reviewed. The chart was checked.  Physical Findings  weight is 221 lb 11.2 oz (100.562 kg). His oral temperature is 98.2 F (36.8 C). His blood pressure is 160/73 and his pulse is 73. His respiration is 20. . Weight essentially stable.  No significant changes.  Impression The patient is tolerating radiation.  Plan Continue treatment as planned. Discussed using catheter clamp to control bladder leaks during treatment.  If interested, patient knows to ask treatment staff.     Sheral Apley Tammi Klippel, M.D.  This document serves as a record of services personally performed by Tyler Pita, MD. It was created on his behalf by Derek Mound, a trained medical scribe. The creation of this record is based on the scribe's personal observations and the provider's statements to them. This document has been checked and approved by the attending provider.

## 2015-12-09 ENCOUNTER — Ambulatory Visit
Admission: RE | Admit: 2015-12-09 | Discharge: 2015-12-09 | Disposition: A | Discharge status: Home / Self Care | Payer: Federal, State, Local not specified - PPO | Source: Ambulatory Visit | Attending: Radiation Oncology | Admitting: Radiation Oncology

## 2015-12-09 DIAGNOSIS — Z510 Encounter for antineoplastic radiation therapy: Secondary | ICD-10-CM | POA: Diagnosis not present

## 2015-12-12 ENCOUNTER — Ambulatory Visit
Admission: RE | Admit: 2015-12-12 | Discharge: 2015-12-12 | Disposition: A | Discharge status: Home / Self Care | Payer: Federal, State, Local not specified - PPO | Source: Ambulatory Visit | Attending: Radiation Oncology | Admitting: Radiation Oncology

## 2015-12-12 DIAGNOSIS — Z510 Encounter for antineoplastic radiation therapy: Secondary | ICD-10-CM | POA: Diagnosis not present

## 2015-12-13 ENCOUNTER — Ambulatory Visit
Admission: RE | Admit: 2015-12-13 | Discharge: 2015-12-13 | Disposition: A | Discharge status: Home / Self Care | Payer: Federal, State, Local not specified - PPO | Source: Ambulatory Visit | Attending: Radiation Oncology | Admitting: Radiation Oncology

## 2015-12-13 DIAGNOSIS — Z510 Encounter for antineoplastic radiation therapy: Secondary | ICD-10-CM | POA: Diagnosis not present

## 2015-12-14 ENCOUNTER — Ambulatory Visit
Admission: RE | Admit: 2015-12-14 | Discharge: 2015-12-14 | Disposition: A | Discharge status: Home / Self Care | Payer: Federal, State, Local not specified - PPO | Source: Ambulatory Visit | Attending: Radiation Oncology | Admitting: Radiation Oncology

## 2015-12-14 ENCOUNTER — Telehealth: Payer: Self-pay | Admitting: Radiation Oncology

## 2015-12-14 DIAGNOSIS — Z510 Encounter for antineoplastic radiation therapy: Secondary | ICD-10-CM | POA: Diagnosis not present

## 2015-12-14 NOTE — Telephone Encounter (Signed)
Returned patient's call. Patient reports he continues to "flood the girls." Patient expresses that he is so embarrassed by his urinary incontinence that he wants to try wearing a penile clamp. Informed therapist of these findings. Informed Shona Simpson,  PA of these findings.

## 2015-12-15 ENCOUNTER — Encounter: Payer: Self-pay | Admitting: Radiation Oncology

## 2015-12-15 ENCOUNTER — Ambulatory Visit
Admission: RE | Admit: 2015-12-15 | Discharge: 2015-12-15 | Disposition: A | Discharge status: Home / Self Care | Payer: Federal, State, Local not specified - PPO | Source: Ambulatory Visit | Attending: Radiation Oncology | Admitting: Radiation Oncology

## 2015-12-15 DIAGNOSIS — Z510 Encounter for antineoplastic radiation therapy: Secondary | ICD-10-CM | POA: Diagnosis not present

## 2015-12-16 ENCOUNTER — Ambulatory Visit
Admission: RE | Admit: 2015-12-16 | Discharge: 2015-12-16 | Disposition: A | Discharge status: Home / Self Care | Payer: Federal, State, Local not specified - PPO | Source: Ambulatory Visit | Attending: Radiation Oncology | Admitting: Radiation Oncology

## 2015-12-16 ENCOUNTER — Encounter: Payer: Self-pay | Admitting: Radiation Oncology

## 2015-12-16 VITALS — BP 130/66 | HR 72 | Resp 16 | Wt 223.6 lb

## 2015-12-16 DIAGNOSIS — Z510 Encounter for antineoplastic radiation therapy: Secondary | ICD-10-CM | POA: Diagnosis not present

## 2015-12-16 DIAGNOSIS — C61 Malignant neoplasm of prostate: Secondary | ICD-10-CM

## 2015-12-16 NOTE — Progress Notes (Signed)
  Radiation Oncology         [phone]   Name: Tanner Richardson. MRN: [id number]   Date: 12/16/2015  DOB: 1941/05/12     Weekly Radiation Therapy Management    ICD-9-CM ICD-10-CM   1. Prostate cancer ([place]) 185 C61     Current Dose: 50.4 Gy  Planned Dose:  68.4 Gy  Narrative The patient presents for routine under treatment assessment.  Weight and vitals stable. Denies pain. Reports nocturia x 3-4. Reports dysuria earlier in the week that was slight but, has resolved. Denies hematuria. Reports urgency. Reports occasional leakage or incontinence continues. Denies diarrhea. Denies fatigue.   The patient is without complaint. Set-up films were reviewed. The chart was checked.  Physical Findings  weight is 223 lb 9.6 oz (101.424 kg). His blood pressure is 130/66 and his pulse is 72. His respiration is 16. . Weight essentially stable.  No significant changes.  Impression The patient is tolerating radiation.  Plan Continue treatment as planned. Using penile clamp during treatment with success.     Sheral Apley Tammi Klippel, M.D.  This document serves as a record of services personally performed by Tyler Pita, MD. It was created on his behalf by Derek Mound, a trained medical scribe. The creation of this record is based on the scribe's personal observations and the provider's statements to them. This document has been checked and approved by the attending provider.

## 2015-12-16 NOTE — Progress Notes (Signed)
Weight and vitals stable. Denies pain. Reports nocturia x 3-4. Reports dysuria earlier in the week that was slight but, has resolved. Denies hematuria. Reports urgency. Reports occasional leakage or incontinence continues.   Denies diarrhea. Denies fatigue.     BP 130/66 mmHg  Pulse 72  Resp 16  Wt 223 lb 9.6 oz (101.424 kg)  Wt Readings from Last 3 Encounters:  12/16/15 223 lb 9.6 oz (101.424 kg)  12/08/15 221 lb 11.2 oz (100.562 kg)  12/02/15 223 lb 14.4 oz (101.56 kg)

## 2015-12-19 ENCOUNTER — Ambulatory Visit
Admission: RE | Admit: 2015-12-19 | Discharge: 2015-12-19 | Disposition: A | Discharge status: Home / Self Care | Payer: Federal, State, Local not specified - PPO | Source: Ambulatory Visit | Attending: Radiation Oncology | Admitting: Radiation Oncology

## 2015-12-19 DIAGNOSIS — Z510 Encounter for antineoplastic radiation therapy: Secondary | ICD-10-CM | POA: Diagnosis not present

## 2015-12-20 ENCOUNTER — Ambulatory Visit
Admission: RE | Admit: 2015-12-20 | Discharge: 2015-12-20 | Disposition: A | Discharge status: Home / Self Care | Payer: Federal, State, Local not specified - PPO | Source: Ambulatory Visit | Attending: Radiation Oncology | Admitting: Radiation Oncology

## 2015-12-20 DIAGNOSIS — Z510 Encounter for antineoplastic radiation therapy: Secondary | ICD-10-CM | POA: Diagnosis not present

## 2015-12-21 ENCOUNTER — Ambulatory Visit
Admission: RE | Admit: 2015-12-21 | Discharge: 2015-12-21 | Disposition: A | Discharge status: Home / Self Care | Payer: Federal, State, Local not specified - PPO | Source: Ambulatory Visit | Attending: Radiation Oncology | Admitting: Radiation Oncology

## 2015-12-21 DIAGNOSIS — Z510 Encounter for antineoplastic radiation therapy: Secondary | ICD-10-CM | POA: Diagnosis not present

## 2015-12-22 ENCOUNTER — Ambulatory Visit
Admission: RE | Admit: 2015-12-22 | Discharge: 2015-12-22 | Disposition: A | Discharge status: Home / Self Care | Payer: Federal, State, Local not specified - PPO | Source: Ambulatory Visit | Attending: Radiation Oncology | Admitting: Radiation Oncology

## 2015-12-22 DIAGNOSIS — Z510 Encounter for antineoplastic radiation therapy: Secondary | ICD-10-CM | POA: Diagnosis not present

## 2015-12-23 ENCOUNTER — Ambulatory Visit
Admission: RE | Admit: 2015-12-23 | Discharge: 2015-12-23 | Disposition: A | Discharge status: Home / Self Care | Payer: Federal, State, Local not specified - PPO | Source: Ambulatory Visit | Attending: Radiation Oncology | Admitting: Radiation Oncology

## 2015-12-23 VITALS — BP 138/75 | HR 75 | Resp 16 | Wt 224.1 lb

## 2015-12-23 DIAGNOSIS — C61 Malignant neoplasm of prostate: Secondary | ICD-10-CM

## 2015-12-23 DIAGNOSIS — Z510 Encounter for antineoplastic radiation therapy: Secondary | ICD-10-CM | POA: Diagnosis not present

## 2015-12-23 NOTE — Progress Notes (Signed)
  Radiation Oncology         [phone]   Name: Tanner Richardson. MRN: [id number]   Date: 12/23/2015  DOB: 1941/01/28     Weekly Radiation Therapy Management    ICD-9-CM ICD-10-CM   1. Prostate cancer ([place]) 185 C61     Current Dose: 59.4 Gy  Planned Dose:  68.4 Gy  Narrative The patient presents for routine under treatment assessment.  Weight and vitals stable. Denies pain. Reports nocturia x 3. Reports mild occasional discomfort after he voids. Denies hematuria. Reports urgency. Reports occasional leakage or incontinence continues. Denies diarrhea. Reports fatigue. Reports penile clamp was working well to prevent urinary leakage until today when the clamp came off and he wet the treatment table. The patient is scheduled to complete treatment next Friday.  The patient is without complaint. Set-up films were reviewed. The chart was checked.  Physical Findings  weight is 224 lb 1.6 oz (101.651 kg). His blood pressure is 138/75 and his pulse is 75. His respiration is 16 and oxygen saturation is 100%. . Weight essentially stable.  No significant changes.  Impression The patient is tolerating radiation.  Plan Continue treatment as planned.     Sheral Apley Tammi Klippel, M.D.  This document serves as a record of services personally performed by Tyler Pita, MD. It was created on his behalf by Arlyce Harman, a trained medical scribe. The creation of this record is based on the scribe's personal observations and the provider's statements to them. This document has been checked and approved by the attending provider.

## 2015-12-23 NOTE — Progress Notes (Signed)
Weight and vitals stable. Denies pain. Reports nocturia x 3. Reports mild occasional discomfort after he voids. Denies hematuria. Reports urgency. Reports occasional leakage or incontinence continues. Denies diarrhea. Reports fatigue. Reports penile clamp was working well to prevent urinary leakage until today when the clamp came off and he wet the treatment table.    BP 138/75 mmHg  Pulse 75  Resp 16  Wt 224 lb 1.6 oz (101.651 kg)  SpO2 100% Wt Readings from Last 3 Encounters:  12/23/15 224 lb 1.6 oz (101.651 kg)  12/16/15 223 lb 9.6 oz (101.424 kg)  12/08/15 221 lb 11.2 oz (100.562 kg)

## 2015-12-26 ENCOUNTER — Ambulatory Visit
Admission: RE | Admit: 2015-12-26 | Discharge: 2015-12-26 | Disposition: A | Discharge status: Home / Self Care | Payer: Federal, State, Local not specified - PPO | Source: Ambulatory Visit | Attending: Radiation Oncology | Admitting: Radiation Oncology

## 2015-12-26 DIAGNOSIS — Z510 Encounter for antineoplastic radiation therapy: Secondary | ICD-10-CM | POA: Diagnosis not present

## 2015-12-27 ENCOUNTER — Ambulatory Visit
Admission: RE | Admit: 2015-12-27 | Discharge: 2015-12-27 | Disposition: A | Discharge status: Home / Self Care | Payer: Federal, State, Local not specified - PPO | Source: Ambulatory Visit | Attending: Radiation Oncology | Admitting: Radiation Oncology

## 2015-12-27 DIAGNOSIS — Z510 Encounter for antineoplastic radiation therapy: Secondary | ICD-10-CM | POA: Diagnosis not present

## 2015-12-28 ENCOUNTER — Ambulatory Visit
Admission: RE | Admit: 2015-12-28 | Discharge: 2015-12-28 | Disposition: A | Discharge status: Home / Self Care | Payer: Federal, State, Local not specified - PPO | Source: Ambulatory Visit | Attending: Radiation Oncology | Admitting: Radiation Oncology

## 2015-12-28 DIAGNOSIS — Z510 Encounter for antineoplastic radiation therapy: Secondary | ICD-10-CM | POA: Diagnosis not present

## 2015-12-29 ENCOUNTER — Ambulatory Visit
Admission: RE | Admit: 2015-12-29 | Discharge: 2015-12-29 | Disposition: A | Discharge status: Home / Self Care | Payer: Federal, State, Local not specified - PPO | Source: Ambulatory Visit | Attending: Radiation Oncology | Admitting: Radiation Oncology

## 2015-12-29 ENCOUNTER — Encounter: Payer: Self-pay | Admitting: Radiation Oncology

## 2015-12-29 ENCOUNTER — Encounter: Payer: Self-pay | Admitting: Medical Oncology

## 2015-12-29 VITALS — BP 135/62 | HR 72 | Resp 16 | Wt 222.7 lb

## 2015-12-29 DIAGNOSIS — C61 Malignant neoplasm of prostate: Secondary | ICD-10-CM

## 2015-12-29 DIAGNOSIS — Z510 Encounter for antineoplastic radiation therapy: Secondary | ICD-10-CM | POA: Diagnosis not present

## 2015-12-29 NOTE — Progress Notes (Signed)
  Radiation Oncology         [phone]   Name: Tanner Richardson. MRN: [id number]   Date: 12/29/2015  DOB: 01/03/1941     Weekly Radiation Therapy Management    ICD-9-CM ICD-10-CM   1. Prostate cancer ([place]) 185 C61     Current Dose: 66.6 Gy  Planned Dose:  68.4 Gy  Narrative The patient presents for routine under treatment assessment.  Weight and vitals stable. Denies pain. Reports nocturia x 3. Reports mild occasional discomfort after he voids. Reports urinary urgency. Reports occasional leakage or incontinence. Denies diarrhea. Reports fatigue.  Set-up films were reviewed. The chart was checked.  Physical Findings  weight is 222 lb 11.2 oz (101.016 kg). His blood pressure is 135/62 and his pulse is 72. His respiration is 16 and oxygen saturation is 100%. . Weight essentially stable.  No significant changes.  Impression The patient is tolerating radiation.  Plan Continue treatment as planned. A one month follow up appointment card given.    Sheral Apley Tammi Klippel, M.D.  This document serves as a record of services personally performed by Tyler Pita, MD. It was created on his behalf by Arlyce Harman, a trained medical scribe. The creation of this record is based on the scribe's personal observations and the provider's statements to them. This document has been checked and approved by the attending provider.

## 2015-12-29 NOTE — Progress Notes (Signed)
Weight and vitals stable. Denies pain. Reports nocturia x 3 . Reports mild occasional discomfort after he voids. Reports urinary urgency. Reports occasional leakage or incontinence. Denies diarrhea. Reports fatigue. One month follow up appointment card given.   BP 135/62 mmHg  Pulse 72  Resp 16  Wt 222 lb 11.2 oz (101.016 kg)  SpO2 100% Wt Readings from Last 3 Encounters:  12/29/15 222 lb 11.2 oz (101.016 kg)  12/23/15 224 lb 1.6 oz (101.651 kg)  12/16/15 223 lb 9.6 oz (101.424 kg)

## 2015-12-30 ENCOUNTER — Ambulatory Visit
Admission: RE | Admit: 2015-12-30 | Discharge: 2015-12-30 | Disposition: A | Discharge status: Home / Self Care | Payer: Federal, State, Local not specified - PPO | Source: Ambulatory Visit | Attending: Radiation Oncology | Admitting: Radiation Oncology

## 2015-12-30 ENCOUNTER — Encounter: Payer: Self-pay | Admitting: Radiation Oncology

## 2015-12-30 ENCOUNTER — Encounter: Payer: Self-pay | Admitting: Medical Oncology

## 2015-12-30 DIAGNOSIS — Z510 Encounter for antineoplastic radiation therapy: Secondary | ICD-10-CM | POA: Diagnosis not present

## 2016-01-08 NOTE — Progress Notes (Signed)
  Radiation Oncology         [phone]) [phone] ________________________________  Name: Tanner Richardson. MRN: [id number]  Date: 12/30/2015  DOB: 31-Mar-1941  End of Treatment Note  DIAGNOSIS: 75 y.o. 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  Indication for treatment:  Curative, Prostatic Fossa Radiotherapy       Radiation treatment dates:   11/09/2015-12/30/2015  Site/dose:   The prostatic fossa was treated to 68.4 Gy in 38 fractions of 1.8 Gy  Beams/energy:   The prostatic fossa was treated using helical intensity modulated radiotherapy delivering 6 megavolt photons. Image guidance was performed with megavoltage CT studies prior to each fraction. He was immobilized with a body fix lower extremity mold.  Narrative: The patient tolerated radiation treatment relatively well.   Weight and vitals remained stable. Denied pain. Reported nocturia x 3 . Reported mild occasional discomfort after he voided. Reported urinary urgency. Reported occasional leakage or incontinence. Denied diarrhea. Reported fatigue.   Plan: The patient has completed radiation treatment. He will return to radiation oncology clinic for routine followup in one month. I advised him to call or return sooner if he has any questions or concerns related to his recovery or treatment. ________________________________  Sheral Apley. Tammi Klippel, M.D.

## 2016-01-11 ENCOUNTER — Other Ambulatory Visit: Payer: Self-pay | Admitting: Family Medicine

## 2016-01-11 ENCOUNTER — Ambulatory Visit (HOSPITAL_COMMUNITY): Payer: Federal, State, Local not specified - PPO

## 2016-01-11 DIAGNOSIS — R911 Solitary pulmonary nodule: Secondary | ICD-10-CM

## 2016-01-12 ENCOUNTER — Ambulatory Visit: Payer: Federal, State, Local not specified - PPO | Admitting: Radiation Oncology

## 2016-01-18 ENCOUNTER — Ambulatory Visit
Admission: RE | Admit: 2016-01-18 | Discharge: 2016-01-18 | Disposition: A | Discharge status: Home / Self Care | Payer: Federal, State, Local not specified - PPO | Source: Ambulatory Visit | Attending: Family Medicine | Admitting: Family Medicine

## 2016-01-18 DIAGNOSIS — R911 Solitary pulmonary nodule: Secondary | ICD-10-CM

## 2016-02-09 ENCOUNTER — Encounter: Payer: Self-pay | Admitting: Radiation Oncology

## 2016-02-09 ENCOUNTER — Ambulatory Visit
Admission: RE | Admit: 2016-02-09 | Discharge: 2016-02-09 | Disposition: A | Discharge status: Home / Self Care | Payer: Federal, State, Local not specified - PPO | Source: Ambulatory Visit | Attending: Radiation Oncology | Admitting: Radiation Oncology

## 2016-02-09 VITALS — BP 132/73 | HR 72 | Temp 97.9°F | Ht 71.0 in | Wt 219.3 lb

## 2016-02-09 DIAGNOSIS — C61 Malignant neoplasm of prostate: Secondary | ICD-10-CM

## 2016-02-09 NOTE — Progress Notes (Signed)
Tanner Richardson denies any pain upon urination nor straining.  Complete emptying.

## 2016-02-10 ENCOUNTER — Telehealth: Payer: Self-pay | Admitting: Radiation Oncology

## 2016-02-10 NOTE — Progress Notes (Signed)
Radiation Oncology         [phone]) [phone] ________________________________  Name: Tanner Richardson. 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    ICD-9-CM ICD-10-CM   1. Prostate cancer ([facility]) 185 C61     Interval Since Last Radiation:  1  months  11/09/2015-12/30/2015:The prostatic fossa was treated to 68.4 Gy in 38 fractions of 1.8 Gy  Narrative:  The patient returns today for routine follow-up since completing his radiotherapy. During treatment he did have trouble with incontinence while trying to maintain a full bladder for treatment as well as dysuria.  On review of systems, the patient reports he is doing well overall. He denies any persistent symptoms of concern and reports that he is urinating well without hesitancy, dysuria, or hematuria. He does complain of multiple hot flashes that have bothered him more in the last 3 weeks and are awaking him 4-7 times a night. He is curious about if he can take any medication for this. No other complaints are noted.                       ALLERGIES:  is allergic to chicken allergy; meloxicam; and poultry meal.  Meds: Current Outpatient Prescriptions  Medication Sig Dispense Refill  . acetaminophen (TYLENOL) 325 MG tablet Take 650 mg by mouth every 6 (six) hours as needed.    Marland Kitchen aspirin 81 MG tablet Take 81 mg by mouth daily.    Marland Kitchen CIALIS 5 MG tablet Take 1 tablet by mouth daily.  3  . Coenzyme Q10 (CO Q 10) 100 MG CAPS Take 1 tablet by mouth daily.    . Glucosamine-Chondroitin (GLUCOSAMINE CHONDR COMPLEX PO) Take 1 tablet by mouth daily.    . Multiple Vitamins-Minerals (MULTIVITAMIN WITH MINERALS) tablet Take 1 tablet by mouth daily.    . Omega-3 Fatty Acids (FISH OIL) 1200 MG CAPS Take 1  capsule by mouth daily.    . Red Yeast Rice 600 MG CAPS Take 1 capsule by mouth. 1 capsule in the AM and 2 capsules in the PM    . sodium chloride (OCEAN) 0.65 % SOLN nasal spray Place 1 spray into both nostrils 2 (two) times daily as needed for congestion.    . vitamin C (ASCORBIC ACID) 500 MG tablet Take 500 mg by mouth daily.     No current facility-administered medications for this encounter.    Physical Findings:  height is 5\' 11"  (1.803 m) and weight is 219 lb 4.8 oz (99.474 kg). His temperature is 97.9 F (36.6 C). His blood pressure is 132/73 and his pulse is 72.   Pain scale 0/10 In general this is a well appearing caucasian male in no acute distress. He's alert and oriented x4 and appropriate throughout the examination. Cardiopulmonary assessment is negative for acute distress and he exhibits normal effort.    Lab Findings: Lab Results  Component Value Date   WBC 3.9* 08/12/2015   HGB 14.3 08/12/2015   HCT 43.0 08/12/2015   PLT 186 08/12/2015    Lab Results  Component Value Date   NA 141 08/12/2015   K 4.0 08/12/2015   CO2 29 08/12/2015   GLUCOSE 103* 08/12/2015   BUN 15 08/12/2015  CREATININE 0.70 08/12/2015   CALCIUM 8.9 08/12/2015   ANIONGAP 5 08/12/2015    Radiographic Findings: Ct Chest Wo Contrast  01/18/2016  CLINICAL DATA:  Follow-up pulmonary nodule. History of prostate cancer and melanoma. EXAM: CT CHEST WITHOUT CONTRAST TECHNIQUE: Multidetector CT imaging of the chest was performed following the standard protocol without IV contrast. COMPARISON:  07/20/2015 chest CT. FINDINGS: Mediastinum/Nodes: Normal heart size. No pericardial fluid/thickening. Coronary atherosclerosis. Great vessels are normal in course and caliber. Normal visualized thyroid. Normal esophagus. No pathologically enlarged axillary, mediastinal or gross hilar lymph nodes, noting limited sensitivity for the detection of hilar adenopathy on this noncontrast study. Lungs/Pleura: No  pneumothorax. No pleural effusion. Subpleural 1.1 x 0.6 cm basilar right middle lobe pulmonary nodule associated with the right major fissure (series 5/ image 45) previously measured 1.1 x 0.6 cm, unchanged. There are additional 2-3 mm pulmonary nodules in the bilateral upper lobes (series 5/images 19 and 28 on the right and series 5/image 28 on the left), all unchanged since 07/20/2015. No acute consolidative airspace disease or new significant pulmonary nodules. Stable minimal scarring in the medial basilar right lower lobe. Upper abdomen: Two simple liver cysts, largest 1.3 cm, unchanged. Small hiatal hernia. Musculoskeletal: No aggressive appearing focal osseous lesions. Stable subcentimeter sclerotic focus in the posterior left seventh rib, most consistent with a benign bone island. Moderate degenerative changes in the thoracic spine. IMPRESSION: 1. Stable pulmonary nodules, largest 1.1 x 0.6 cm in the right middle lobe, for which 6 month stability has been demonstrated. Continued chest CT surveillance is advised in 6 months. 2. Coronary atherosclerosis. Electronically Signed   By: Ilona Sorrel M.D.   On: 01/18/2016 16:18    Impression/Plan: 1. 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 The patient is clinically doing well since completing his radiotherapy. His urinary symptoms have improved, and we will defer his care back to Dr. [name] for follow up. He has plans to be seen in July, and we will be happy to see him back if there are any concerns from his previous therapy.  2. 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 I discussed his symptoms, and I will contact Dr. [name] to review his suggestions of management of this. He states agreement.     Carola Rhine, PAC

## 2016-02-10 NOTE — Telephone Encounter (Signed)
LM for pt to call me to review discussion I had with Dr. [name]

## 2016-02-13 ENCOUNTER — Telehealth: Payer: Self-pay | Admitting: Radiation Oncology

## 2016-02-13 NOTE — Telephone Encounter (Signed)
With the patient in follow-up after I had a chance to speak with Dr. [name] about his vasomotor symptoms. Dr. [name] suggested 1 tablespoon and flaxseed daily, and suggested that this be ground and added to applesauce her oatmeal. He also suggested the use of low-dose venlafaxine, and recommended against vitamin E. He will be seeing the patient in a few months, and I have offered to fill a prescription for venlafaxine or for gabapentin as well as this was also studied, the patient is going to try to flaxseed and keep me informed of how he's doing. We will follow this expectantly.

## 2016-05-14 NOTE — H&P (Signed)
Tanner Richardson W  Date of visit:  05/14/2016 DOB:  December 11, 1940    Age:  75 yrs. Medical record number:  [id number]     Account number:  [id number] Primary Care Provider: Ernestine Conrad ____________________________ CURRENT DIAGNOSES  1. Persistent atrial fibrillation  2. Long term (current) use of anticoagulants  3. Hyperlipidemia  4. CAD Native without angina  5. Malignant Neoplasm Of Prostate  6. Obesity ____________________________ ALLERGIES  Meloxicam, Intolerance-unknown ____________________________ MEDICATIONS  1. glucosamine-chondroit-herb149-hyal 750 mg-100 mg-125 mg-1.65 mg tablet, 3 p.o. daily  2. red yeast rice 600 mg capsule, 3 p.o. daily  3. Cialis 5 mg tablet, 1 p.o. daily  4. multivitamin tablet, 1 p.o. daily  5. Vitamin C 1,000 mg tablet, 1 p.o. daily  6. Eliquis 5 mg tablet, BID  7. Claritin-D 24 Hour 10 mg-240 mg tablet,extended release, PRN  8. coenzyme Q10 (ubiquinol) 200 mg capsule, 1 p.o. daily  9. Tylenol Arthritis Pain 650 mg tablet,extended release, BID ____________________________ CHIEF COMPLAINTS  Followup of CAD Native without angina  Followup of Persistent atrial fibrillation ____________________________ HISTORY OF PRESENT ILLNESS Patient returns for cardiac followup. He still has some mild dyspnea with exertion. He took a trip to [place] and tolerated it fairly well. After discussion with his wife he would like to have a cardioversion of his atrial fibrillation to determine if he will maintain sinus rhythm. He denies angina and has no PND or orthopnea no edema. He has been anticoagulated with Eliquis and not missed any doses. ____________________________ PAST HISTORY  Past Medical Illnesses:  hyperlipidemia, prostate cancer;  Cardiovascular Illnesses:  CAD, atrial fibrillation;  Surgical Procedures:  laparoscopic prostatectomy, melanoma surgery, hernia repair;  NYHA Classification:  I;  Canadian Angina Classification:  Class 0: Asymptomatic;   Cardiology Procedures-Invasive:  no previous interventional or invasive cardiology procedures;  Cardiology Procedures-Noninvasive:  treadmill Myoview April 2017;  Peripheral Vascular Procedures:  no previous invasive peripheral vascular procedures.;  LVEF of 73% documented via nuclear study on 02/23/2016,   CHA2DS2-VASC Score:  2 ____________________________ CARDIO-PULMONARY TEST DATES EKG Date:  02/01/2016;  Nuclear Study Date:  02/23/2016;  Echocardiography Date: 02/13/2016;   ____________________________ FAMILY HISTORY Father -- Father dead, Brain disorder Mother -- Mother dead, Bowel cancer ____________________________ SOCIAL HISTORY Alcohol Use:  mixed drinks 1 per day;  Smoking:  used to smoke but quit, 40 pack year history;  Diet:  regular diet;  Lifestyle:  divorced, remarried and 1 son;  Education:  retired Counselling psychologist;  Exercise:  some exercise;  Residence:  lives with wife;   ____________________________ REVIEW OF SYSTEMS General:  obesity  Integumentary:history of melanoma Ears, Nose, Throat, Mouth:  epistaxis Respiratory: denies dyspnea, cough, wheezing or hemoptysis. Cardiovascular:  please review HPI Abdominal: denies dyspepsia, GI bleeding, constipation, or diarrhea Genitourinary-Male: frequency, incontinence, erectile dysfunction  Musculoskeletal:  arthritis of the hands Hematological/Immunologic:  seasonal allergies ____________________________ PHYSICAL EXAMINATION VITAL SIGNS  Blood Pressure:  102/70 Sitting, Right arm, large cuff  , 104/80 Standing, Right arm and large cuff   Pulse:  68/min. Weight:  218.00 lbs. Height:  72"BMI: 29  Constitutional:  pleasant white male in no acute distress, mildly obese Skin:  skin tags, seborrhic keratosis Head:  normocephalic, normal hair pattern, no masses or tenderness Eyes:  EOMS Intact, PERRLA, C and S clear, Funduscopic exam not done. ENT:  ears, nose and throat reveal no gross abnormalities.  Dentition good. Neck:  supple, without  massess. No JVD, thyromegaly or carotid bruits. Carotid upstroke normal. Chest:  normal symmetry,  clear to auscultation. Cardiac:  irregular rhythm, normal S1 and S2, no S3 or S4, no murmur Abdomen:  abdomen soft,non-tender, no masses, no hepatospenomegaly, or aneurysm noted Peripheral Pulses:  the femoral,dorsalis pedis, and posterior tibial pulses are full and equal bilaterally with no bruits auscultated. Neurological:  no gross motor or sensory deficits noted, affect appropriate, oriented x3. ____________________________ MOST RECENT LIPID PANEL 04/28/15  CHOL TOTL 230 mg/dl, LDL 136 NM, HDL 83 mg/dl and TRIGLYCER 55 mg/dl ____________________________ IMPRESSIONS/PLAN  1. Persistent atrial fibrillation symptomatic 2. Long-term use of anticoagulants without complication 3. Coronary artery disease treated medically 4. Hyperlipidemia would benefit from statin therapy  Recommendations:  Plan cardioversion. Discussed risks of cardioversion and also discussed possibility of reversion to atrial fibrillation. He understands and is willing to proceed.  ____________________________ TODAYS ORDERS  1. CBC w/out Diff: Today  2. Comprehensive Metabolic Panel: Today  3. 12 Lead EKG: Today                       ____________________________ Cardiology Physician:  Kerry Hough MD [facility]

## 2016-05-30 ENCOUNTER — Encounter (HOSPITAL_COMMUNITY): Payer: Self-pay | Admitting: *Deleted

## 2016-05-30 ENCOUNTER — Ambulatory Visit (HOSPITAL_COMMUNITY): Payer: Federal, State, Local not specified - PPO | Admitting: Anesthesiology

## 2016-05-30 ENCOUNTER — Encounter (HOSPITAL_COMMUNITY)
Admission: RE | Disposition: A | Discharge status: Home / Self Care | Payer: Self-pay | Source: Ambulatory Visit | Attending: Cardiology

## 2016-05-30 ENCOUNTER — Ambulatory Visit (HOSPITAL_COMMUNITY)
Admission: RE | Admit: 2016-05-30 | Discharge: 2016-05-30 | Disposition: A | Discharge status: Home / Self Care | Payer: Federal, State, Local not specified - PPO | Source: Ambulatory Visit | Attending: Cardiology | Admitting: Cardiology

## 2016-05-30 DIAGNOSIS — Z85820 Personal history of malignant melanoma of skin: Secondary | ICD-10-CM | POA: Diagnosis not present

## 2016-05-30 DIAGNOSIS — E669 Obesity, unspecified: Secondary | ICD-10-CM | POA: Insufficient documentation

## 2016-05-30 DIAGNOSIS — Z87891 Personal history of nicotine dependence: Secondary | ICD-10-CM | POA: Diagnosis not present

## 2016-05-30 DIAGNOSIS — Z7901 Long term (current) use of anticoagulants: Secondary | ICD-10-CM | POA: Insufficient documentation

## 2016-05-30 DIAGNOSIS — K219 Gastro-esophageal reflux disease without esophagitis: Secondary | ICD-10-CM | POA: Diagnosis not present

## 2016-05-30 DIAGNOSIS — Z8546 Personal history of malignant neoplasm of prostate: Secondary | ICD-10-CM | POA: Insufficient documentation

## 2016-05-30 DIAGNOSIS — I481 Persistent atrial fibrillation: Secondary | ICD-10-CM | POA: Diagnosis not present

## 2016-05-30 DIAGNOSIS — E785 Hyperlipidemia, unspecified: Secondary | ICD-10-CM | POA: Insufficient documentation

## 2016-05-30 DIAGNOSIS — I2510 Atherosclerotic heart disease of native coronary artery without angina pectoris: Secondary | ICD-10-CM | POA: Diagnosis not present

## 2016-05-30 DIAGNOSIS — I4891 Unspecified atrial fibrillation: Secondary | ICD-10-CM | POA: Diagnosis present

## 2016-05-30 HISTORY — PX: CARDIOVERSION: SHX1299

## 2016-05-30 SURGERY — CARDIOVERSION
Anesthesia: Monitor Anesthesia Care

## 2016-05-30 MED ORDER — LIDOCAINE 2% (20 MG/ML) 5 ML SYRINGE
INTRAMUSCULAR | Status: DC | PRN
  Administered 2016-05-30: 40 mg via INTRAVENOUS

## 2016-05-30 MED ORDER — PROPOFOL 10 MG/ML IV BOLUS
INTRAVENOUS | Status: AC
  Filled 2016-05-30: qty 20

## 2016-05-30 MED ORDER — PROPOFOL 10 MG/ML IV BOLUS
INTRAVENOUS | Status: DC | PRN
  Administered 2016-05-30: 40 mg via INTRAVENOUS
  Administered 2016-05-30: 70 mg via INTRAVENOUS

## 2016-05-30 MED ORDER — SODIUM CHLORIDE 0.9 % IV SOLN
INTRAVENOUS | Status: DC
  Administered 2016-05-30 (×2): via INTRAVENOUS

## 2016-05-30 MED ORDER — EPHEDRINE 5 MG/ML INJ
INTRAVENOUS | Status: AC
  Filled 2016-05-30: qty 10

## 2016-05-30 MED ORDER — HYDROCORTISONE 1 % EX CREA
1.0000 "application " | TOPICAL_CREAM | Freq: Three times a day (TID) | CUTANEOUS | Status: DC | PRN
  Filled 2016-05-30: qty 28

## 2016-05-30 MED ORDER — PHENYLEPHRINE 40 MCG/ML (10ML) SYRINGE FOR IV PUSH (FOR BLOOD PRESSURE SUPPORT)
PREFILLED_SYRINGE | INTRAVENOUS | Status: AC
  Filled 2016-05-30: qty 10

## 2016-05-30 MED ORDER — SUCCINYLCHOLINE CHLORIDE 200 MG/10ML IV SOSY
PREFILLED_SYRINGE | INTRAVENOUS | Status: AC
  Filled 2016-05-30: qty 10

## 2016-05-30 MED ORDER — LIDOCAINE 2% (20 MG/ML) 5 ML SYRINGE
INTRAMUSCULAR | Status: AC
  Filled 2016-05-30: qty 5

## 2016-05-30 NOTE — Transfer of Care (Signed)
Immediate Anesthesia Transfer of Care Note  Patient: Tanner Richardson.  Procedure(s) Performed: Procedure(s): CARDIOVERSION (N/A)  Patient Location: PACU  Anesthesia Type:MAC  Level of Consciousness: awake, alert , oriented and patient cooperative  Airway & Oxygen Therapy: Patient Spontanous Breathing and Patient connected to nasal cannula oxygen  Post-op Assessment: Report given to RN and Post -op Vital signs reviewed and stable  Post vital signs: Reviewed and stable  Last Vitals:  Vitals:   05/30/16 1048  BP: (!) 156/89  Pulse: 63  Resp: 17  Temp: 36.6 C    Last Pain:  Vitals:   05/30/16 1048  TempSrc: Oral         Complications: No apparent anesthesia complications

## 2016-05-30 NOTE — Discharge Instructions (Signed)
Electrical Cardioversion, Care After °Refer to this sheet in the next few weeks. These instructions provide you with information on caring for yourself after your procedure. Your health care provider may also give you more specific instructions. Your treatment has been planned according to current medical practices, but problems sometimes occur. Call your health care provider if you have any problems or questions after your procedure. °WHAT TO EXPECT AFTER THE PROCEDURE °After your procedure, it is typical to have the following sensations: °· Some redness on the skin where the shocks were delivered. If this is tender, a sunburn lotion or hydrocortisone cream may help. °· Possible return of an abnormal heart rhythm within hours or days after the procedure. °HOME CARE INSTRUCTIONS °· Take medicines only as directed by your health care provider. Be sure you understand how and when to take your medicine. °· Learn how to feel your pulse and check it often. °· Limit your activity for 48 hours after the procedure or as directed by your health care provider. °· Avoid or minimize caffeine and other stimulants as directed by your health care provider. °SEEK MEDICAL CARE IF: °· You feel like your heart is beating too fast or your pulse is not regular. °· You have any questions about your medicines. °· You have bleeding that will not stop. °SEEK IMMEDIATE MEDICAL CARE IF: °· You are dizzy or feel faint. °· It is hard to breathe or you feel short of breath. °· There is a change in discomfort in your chest. °· Your speech is slurred or you have trouble moving an arm or leg on one side of your body. °· You get a serious muscle cramp that does not go away. °· Your fingers or toes turn cold or blue. °  °This information is not intended to replace advice given to you by your health care provider. Make sure you discuss any questions you have with your health care provider. °  °Document Released: 08/05/2013 Document Revised: 11/05/2014  Document Reviewed: 08/05/2013 °Elsevier Interactive Patient Education ©2016 [employer] Inc[employer] ° °

## 2016-05-30 NOTE — CV Procedure (Signed)
Electrical Cardioversion Procedure Note  Tanner Richardson.   75 y.o. male MRN: [id number] DOB: [birth year]  Today's date: 05/30/2016  Procedure: Electrical Cardioversion  Indications:  Atrial Fibrillation  Time Out: Verified patient identification, verified procedure,medications/allergies/relevent history reviewed, required imaging and test results available.  Performed  Procedure Details  The patient was NPO after midnight. Anesthesia was administered at the beside  by Dr. Smith Robert with 40 mg of lidocaine and 70mg  of propofol.  Cardioversion was done with synchronized biphasic defibrillation with AP pads with 120 watts without reversion and then 200 watts with reversion to sinus rhythm with Wenckebach and PAC's.  The patient tolerated the procedure well,.  IMPRESSION:  Successful cardioversion of atrial fibrillation    Tanner Richardson, Tanner Richardson. MD [facility]   05/30/2016, 12:38 PM

## 2016-05-30 NOTE — Anesthesia Postprocedure Evaluation (Signed)
Anesthesia Post Note  Patient: Tanner Richardson.  Procedure(s) Performed: Procedure(s) (LRB): CARDIOVERSION (N/A)  Patient location during evaluation: PACU Anesthesia Type: MAC Level of consciousness: awake and alert Pain management: pain level controlled Vital Signs Assessment: post-procedure vital signs reviewed and stable Respiratory status: spontaneous breathing, nonlabored ventilation, respiratory function stable and patient connected to nasal cannula oxygen Cardiovascular status: stable and blood pressure returned to baseline Anesthetic complications: no    Last Vitals:  Vitals:   05/30/16 1240 05/30/16 1250  BP: (!) 146/78 139/80  Pulse: 85 (!) 56  Resp: 20 14  Temp:      Last Pain:  Vitals:   05/30/16 1230  TempSrc: Oral                 Effie Berkshire

## 2016-05-30 NOTE — Anesthesia Preprocedure Evaluation (Addendum)
Anesthesia Evaluation  Patient identified by MRN, date of birth, ID band Patient awake    Reviewed: Allergy & Precautions, NPO status , Patient's Chart, lab work & pertinent test results  Airway Mallampati: III       Dental  (+) Teeth Intact, Dental Advisory Given   Pulmonary neg pulmonary ROS, former smoker,    breath sounds clear to auscultation       Cardiovascular negative cardio ROS  + dysrhythmias Atrial Fibrillation  Rhythm:Irregular Rate:Abnormal     Neuro/Psych negative neurological ROS  negative psych ROS   GI/Hepatic Neg liver ROS, GERD  ,  Endo/Other  negative endocrine ROS  Renal/GU negative Renal ROS  negative genitourinary   Musculoskeletal  (+) Arthritis ,   Abdominal   Peds negative pediatric ROS (+)  Hematology negative hematology ROS (+)   Anesthesia Other Findings   Reproductive/Obstetrics negative OB ROS                            No results found for: INR, PROTIME   Anesthesia Physical Anesthesia Plan  ASA: III  Anesthesia Plan: MAC   Post-op Pain Management:    Induction: Intravenous  Airway Management Planned: Natural Airway and Simple Face Mask  Additional Equipment:   Intra-op Plan:   Post-operative Plan:   Informed Consent: I have reviewed the patients History and Physical, chart, labs and discussed the procedure including the risks, benefits and alternatives for the proposed anesthesia with the patient or authorized representative who has indicated his/her understanding and acceptance.     Plan Discussed with: CRNA  Anesthesia Plan Comments:         Anesthesia Quick Evaluation

## 2016-07-11 ENCOUNTER — Other Ambulatory Visit: Payer: Self-pay | Admitting: Family Medicine

## 2016-07-11 DIAGNOSIS — R911 Solitary pulmonary nodule: Secondary | ICD-10-CM

## 2016-07-11 DIAGNOSIS — I4819 Other persistent atrial fibrillation: Secondary | ICD-10-CM

## 2016-07-25 ENCOUNTER — Telehealth: Payer: Self-pay | Admitting: Medical Oncology

## 2016-07-25 ENCOUNTER — Other Ambulatory Visit (HOSPITAL_BASED_OUTPATIENT_CLINIC_OR_DEPARTMENT_OTHER): Payer: Self-pay

## 2016-07-25 DIAGNOSIS — I4819 Other persistent atrial fibrillation: Secondary | ICD-10-CM

## 2016-07-25 NOTE — Telephone Encounter (Signed)
I called Mr. Gallacher to follow up with him 6 months post radiation. He was not available but his wife states he is doing well and followed with Dr. [name] in July.  I asked her to call me if I can be of assistance in any way. She voiced understanding.

## 2016-08-02 ENCOUNTER — Ambulatory Visit
Admission: RE | Admit: 2016-08-02 | Discharge: 2016-08-02 | Disposition: A | Discharge status: Home / Self Care | Payer: Federal, State, Local not specified - PPO | Source: Ambulatory Visit | Attending: Family Medicine | Admitting: Family Medicine

## 2016-08-02 DIAGNOSIS — R911 Solitary pulmonary nodule: Secondary | ICD-10-CM

## 2016-08-02 DIAGNOSIS — I4819 Other persistent atrial fibrillation: Secondary | ICD-10-CM

## 2016-08-12 ENCOUNTER — Ambulatory Visit (HOSPITAL_BASED_OUTPATIENT_CLINIC_OR_DEPARTMENT_OTHER): Payer: Federal, State, Local not specified - PPO | Attending: Cardiology | Admitting: Internal Medicine

## 2016-08-12 VITALS — Ht 71.0 in | Wt 218.0 lb

## 2016-08-12 DIAGNOSIS — G4733 Obstructive sleep apnea (adult) (pediatric): Secondary | ICD-10-CM | POA: Insufficient documentation

## 2016-08-12 DIAGNOSIS — I4819 Other persistent atrial fibrillation: Secondary | ICD-10-CM

## 2016-08-12 DIAGNOSIS — G4736 Sleep related hypoventilation in conditions classified elsewhere: Secondary | ICD-10-CM | POA: Insufficient documentation

## 2016-08-12 DIAGNOSIS — I4891 Unspecified atrial fibrillation: Secondary | ICD-10-CM | POA: Diagnosis present

## 2016-08-19 DIAGNOSIS — I481 Persistent atrial fibrillation: Secondary | ICD-10-CM | POA: Diagnosis not present

## 2016-08-19 NOTE — Procedures (Signed)
  Patient Name: Tanner Richardson, Tanner Richardson Date: 08/12/2016 Gender: Male D.O.B: 12/16/1940 Age (years): 75 Referring Provider: Landry Corporal Height (inches): 71 Interpreting Physician: Baird Lyons MD, ABSM Weight (lbs): 215 RPSGT: Madelon Lips BMI: 30 MRN: [id number] Neck Size: 15.75 CLINICAL INFORMATION Sleep Study Type: NPSG Indication for sleep study: Atrial fibrillation- suspect OSA Epworth Sleepiness Score: 14  SLEEP STUDY TECHNIQUE As per the AASM Manual for the Scoring of Sleep and Associated Events v2.3 (April 2016) with a hypopnea requiring 4% desaturations. The channels recorded and monitored were frontal, central and occipital EEG, electrooculogram (EOG), submentalis EMG (chin), nasal and oral airflow, thoracic and abdominal wall motion, anterior tibialis EMG, snore microphone, electrocardiogram, and pulse oximetry.  MEDICATIONS Medications self-administered by patient taken the night of the study : none reported  SLEEP ARCHITECTURE The study was initiated at 11:03:10 PM and ended at 5:32:13 AM. Sleep onset time was 22.5 minutes and the sleep efficiency was 65.2%. The total sleep time was 253.5 minutes. Stage REM latency was 69.5 minutes. The patient spent 11.44% of the night in stage N1 sleep, 61.93% in stage N2 sleep, 0.39% in stage N3 and 26.23% in REM. Alpha intrusion was absent. Supine sleep was 26.23%. Wake after sleep onset 113 minutes  RESPIRATORY PARAMETERS The overall apnea/hypopnea index (AHI) was 7.3 per hour. There were 0 total apneas, including 0 obstructive, 0 central and 0 mixed apneas. There were 31 hypopneas and 26 RERAs. The AHI during Stage REM sleep was 22.6 per hour. AHI while supine was 10.8 per hour. The mean oxygen saturation was 94.36%. The minimum SpO2 during sleep was 80.00%. Loud snoring was noted during this study.  CARDIAC DATA The 2 lead EKG demonstrated atrial fibrillation. The mean heart rate was 56.98 beats per minute. Other EKG  findings include: PVCs.  LEG MOVEMENT DATA The total PLMS were 0 with a resulting PLMS index of 0.00. Associated arousal with leg movement index was 0.0 .  IMPRESSIONS - Mild obstructive sleep apnea occurred during this study (AHI = 7.3/h). - Insufficent early events to meet protocol requirements for split CVPAP titration. - No significant central sleep apnea occurred during this study (CAI = 0.0/h). - Oxygen desaturation was noted during this study (Min O2 = 80.00%), Mean saturation 94.4%.. - The patient snored with Loud snoring volume. - EKG findings include atrial fibrillation. - Clinically significant periodic limb movements did not occur during sleep. No significant associated arousals. - Sleep also disturbed by bathroom x 4  DIAGNOSIS - Obstructive Sleep Apnea (327.23 [G47.33 ICD-10]) - Nocturnal Hypoxemia (327.26 [G47.36 ICD-10])  RECOMMENDATIONS - Mild obstructive sleep apnea. Return to discuss treatment options. - Avoid alcohol, sedatives and other CNS depressants that may worsen sleep apnea and disrupt normal sleep architecture. - Sleep hygiene should be reviewed to assess factors that may improve sleep quality. - Weight management and regular exercise should be initiated or continued if appropriate.  [Electronically signed] 08/19/2016 12:14 PM  Baird Lyons MD, [place], American Board of Sleep Medicine   NPI: [id number]  [place], [place] [facility]  ELECTRONICALLY SIGNED ON:  08/19/2016, 12:10 PM [place] PH: [phone]   FX: [phone] [place]

## 2016-12-03 IMAGING — CT CT CHEST W/O CM
1 of 2 series · 14 of 30 positions shown, 18 images · non-contrast
Comparison: 01/18/2016 and 07/20/2015

CLINICAL DATA: F/u lung noduledx'd [DATE]Hx A-fibHx of melanoma rt
anterior chest -lesion removedhx of prostate ca surg and xrtNo
complaintshx of ex smoker 40 + years

EXAM:
CT CHEST WITHOUT CONTRAST
TECHNIQUE: Multidetector CT imaging of the chest was performed following the
standard protocol without IV contrast.

[Series 3: chest w/(date) · axial · 0.77mm/px · z∈[-136,+152]mm · 14 of 168 slices shown, 18 images]
[im 12/168  mediastinal]
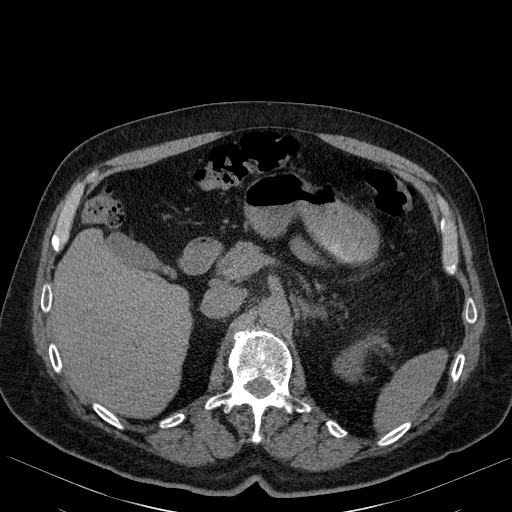
[im 12/168  lung]
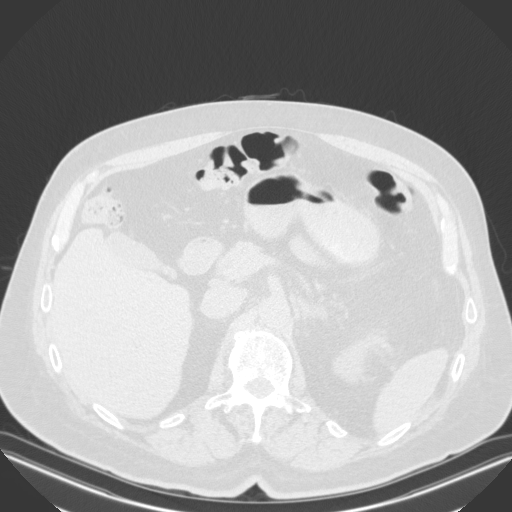
[im 24/168  lung]
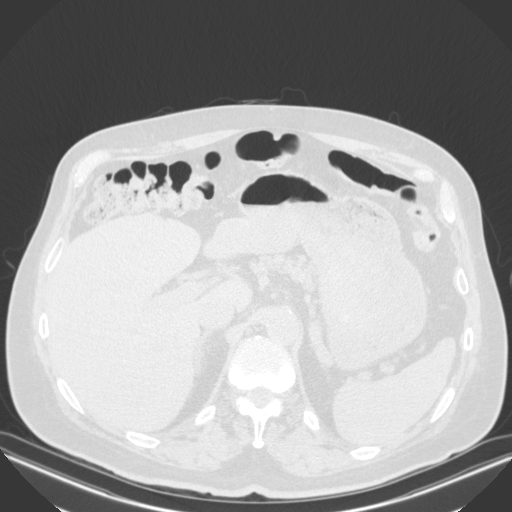
[im 36/168  lung]
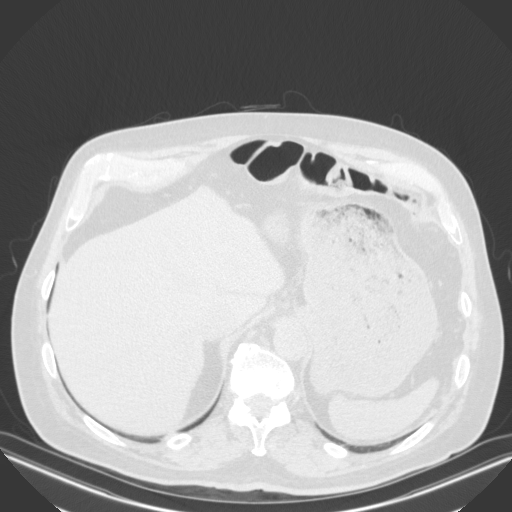
[im 48/168  lung]
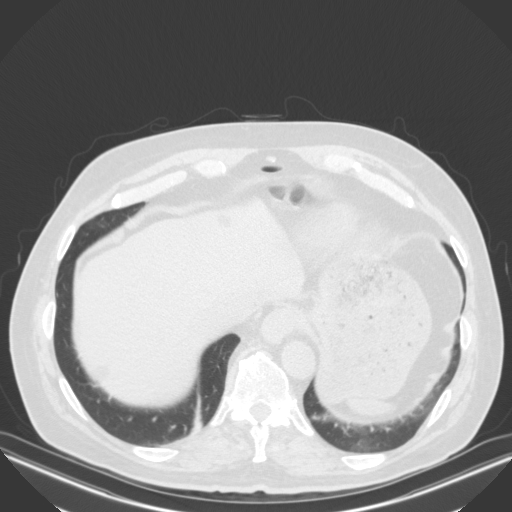
[im 60/168  mediastinal]
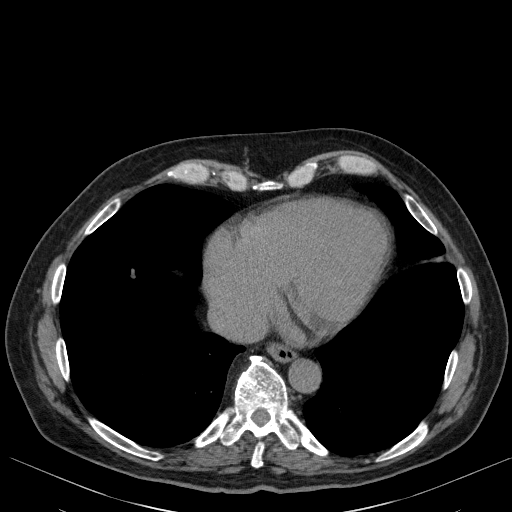
[im 60/168  lung]
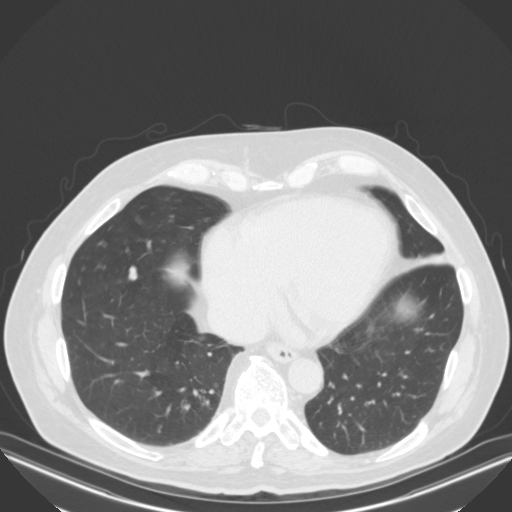
[im 72/168  lung]
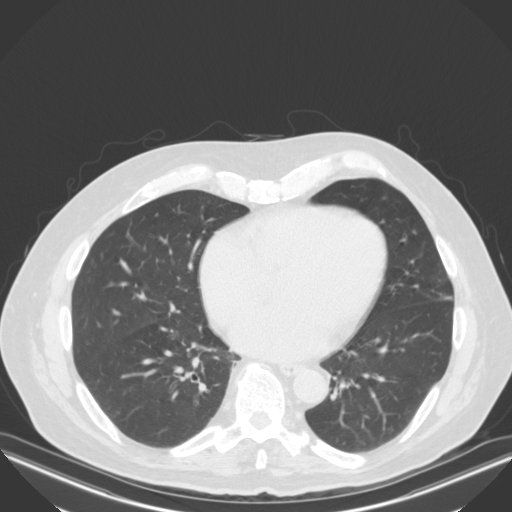
[im 80/168  lung]
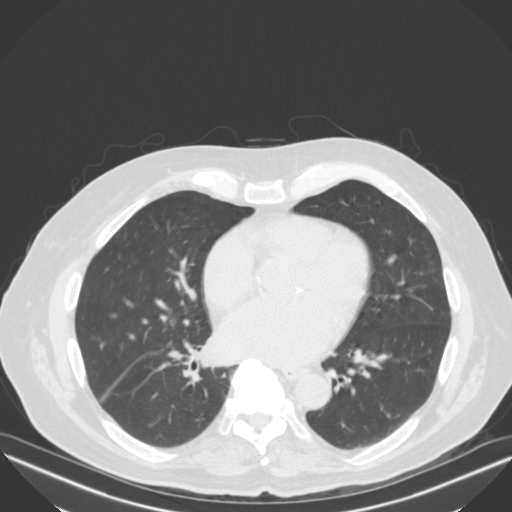
[im 84/168  lung]
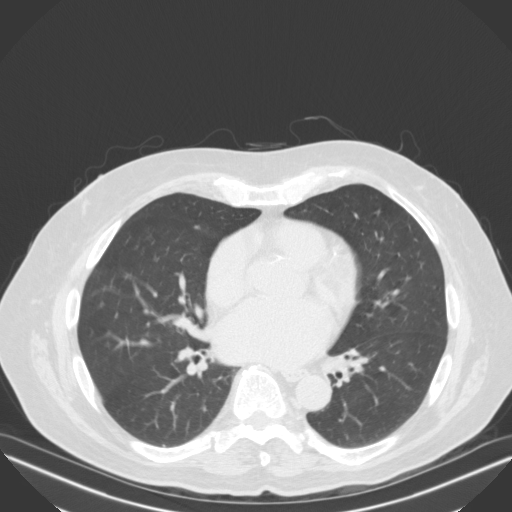
[im 96/168  mediastinal]
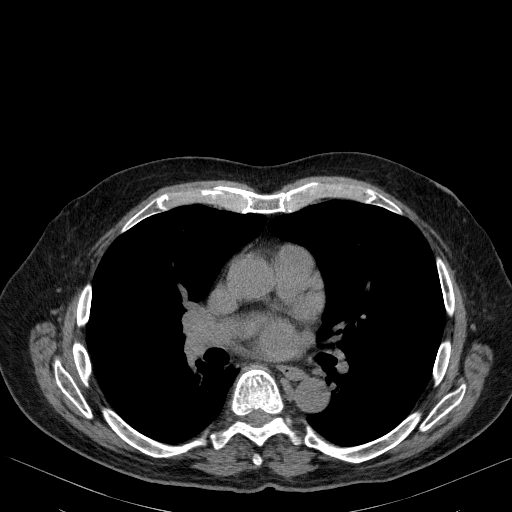
[im 96/168  lung]
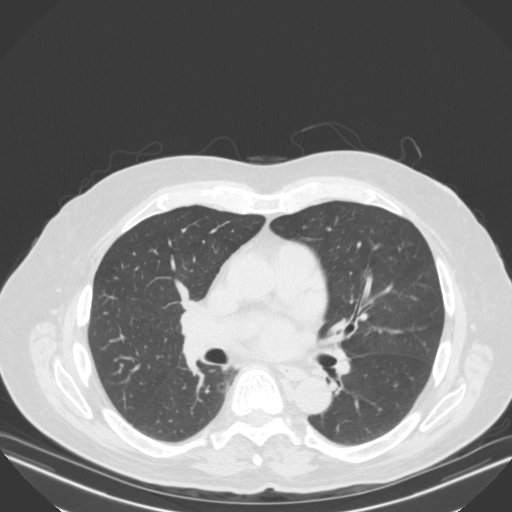
[im 108/168  lung]
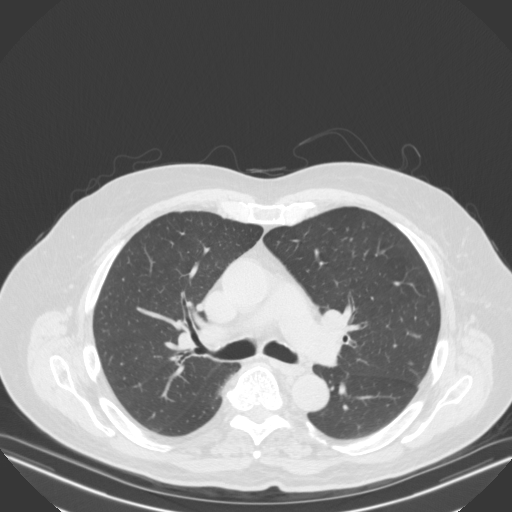
[im 120/168  lung]
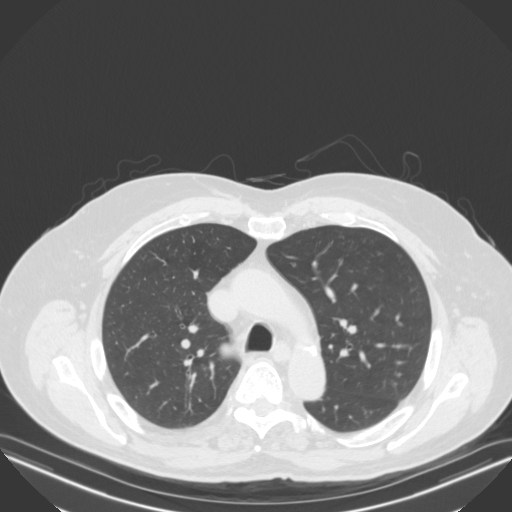
[im 132/168  lung]
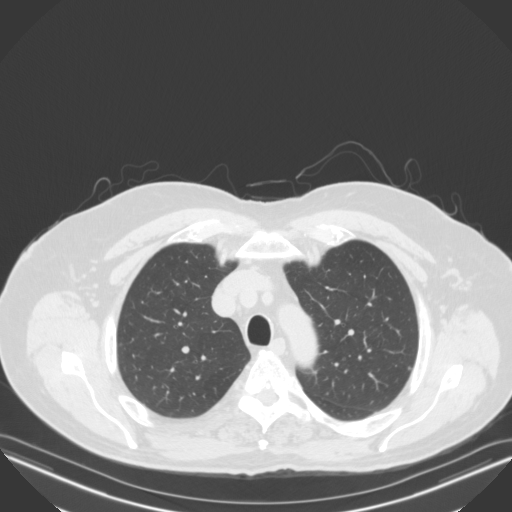
[im 144/168  mediastinal]
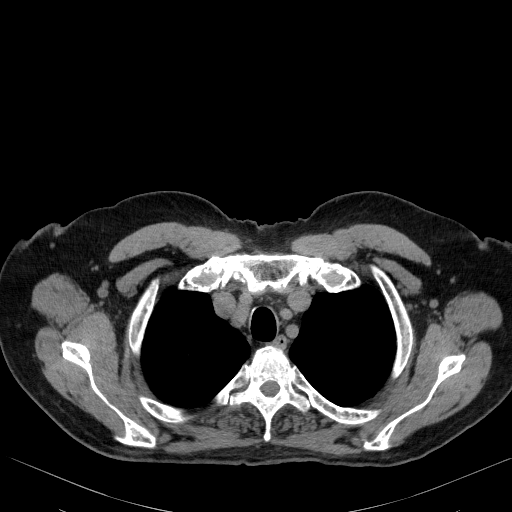
[im 144/168  lung]
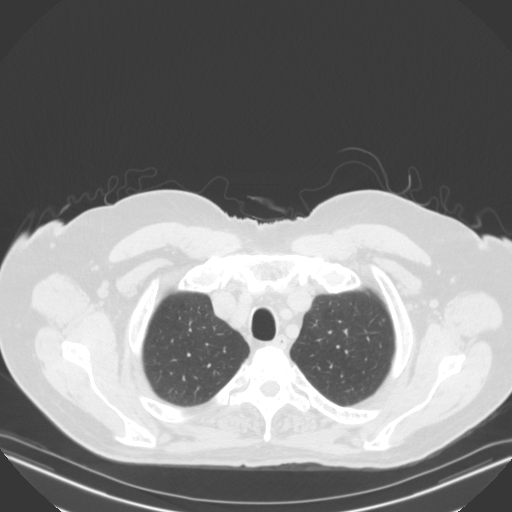
[im 156/168  lung]
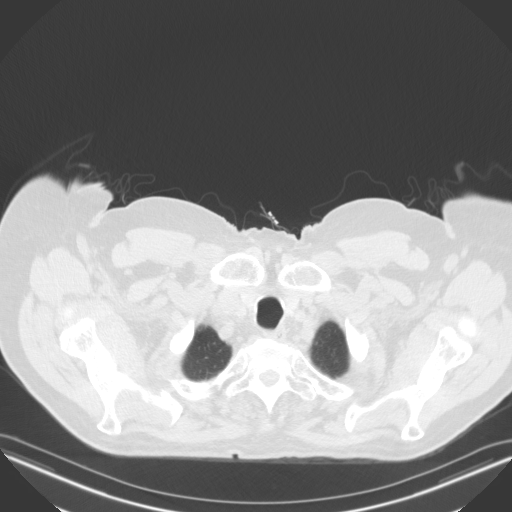

[14 of 30 positions shown; findings below may reference images not displayed]

FINDINGS: Cardiovascular: Heart is normal size and configuration. There are
moderate coronary artery calcifications. Great vessels are normal in
caliber. Minor calcified plaque noted along the aortic arch.

Mediastinum/Nodes: No neck base, axillary, mediastinal or hilar
adenopathy. No mediastinal or hilar masses. Trachea and esophagus
are unremarkable.

Lungs/Pleura: There is several nodules. The largest is a bilobed
nodule, which lies adjacent to the inferior oblique fissure within
the right middle lobe. It currently measures 9 x 7 x 7 mm, unchanged
from the prior exams when allowing for differences in measurement
technique. There are several other small nodules which are stable.
Small calcified granuloma is noted in the right upper lobe, also
stable. There are no new lung nodules. Mild scarring or atelectasis
is noted at the base of the left upper lobe lingula and in the lower
lobes, stable. No evidence of pneumonia or pulmonary edema. No
pleural effusion or pneumothorax.

Upper Abdomen: No acute abnormality.

Musculoskeletal: No chest wall mass or suspicious bone lesions
identified.
IMPRESSION: 1. Stable small lung nodules, largest a bilobed nodule in the right
middle lobe. These have been stable since 07/20/2015 and are likely
all benign. Consider 1 additional follow-up study, given the
patient's history of melanoma, in 1 year to document longer term
stability.
2. No acute findings.
3. Moderate coronary artery calcifications.

## 2017-01-01 ENCOUNTER — Other Ambulatory Visit: Payer: Self-pay | Admitting: Family Medicine

## 2017-01-01 DIAGNOSIS — N5089 Other specified disorders of the male genital organs: Secondary | ICD-10-CM

## 2017-01-03 ENCOUNTER — Ambulatory Visit
Admission: RE | Admit: 2017-01-03 | Discharge: 2017-01-03 | Disposition: A | Discharge status: Home / Self Care | Payer: Federal, State, Local not specified - PPO | Source: Ambulatory Visit | Attending: Family Medicine | Admitting: Family Medicine

## 2017-01-03 DIAGNOSIS — N5089 Other specified disorders of the male genital organs: Secondary | ICD-10-CM

## 2017-05-08 ENCOUNTER — Ambulatory Visit
Admission: RE | Admit: 2017-05-08 | Discharge: 2017-05-08 | Disposition: A | Discharge status: Home / Self Care | Payer: Federal, State, Local not specified - PPO | Source: Ambulatory Visit | Attending: Family Medicine | Admitting: Family Medicine

## 2017-05-08 ENCOUNTER — Other Ambulatory Visit: Payer: Self-pay | Admitting: Family Medicine

## 2017-05-08 DIAGNOSIS — R059 Cough, unspecified: Secondary | ICD-10-CM

## 2017-05-08 DIAGNOSIS — R05 Cough: Secondary | ICD-10-CM

## 2017-05-10 ENCOUNTER — Other Ambulatory Visit: Payer: Self-pay | Admitting: Family Medicine

## 2017-05-10 DIAGNOSIS — R911 Solitary pulmonary nodule: Secondary | ICD-10-CM

## 2017-05-17 ENCOUNTER — Ambulatory Visit
Admission: RE | Admit: 2017-05-17 | Discharge: 2017-05-17 | Disposition: A | Discharge status: Home / Self Care | Payer: Federal, State, Local not specified - PPO | Source: Ambulatory Visit | Attending: Family Medicine | Admitting: Family Medicine

## 2017-05-17 DIAGNOSIS — R911 Solitary pulmonary nodule: Secondary | ICD-10-CM

## 2018-08-17 ENCOUNTER — Encounter: Payer: Self-pay | Admitting: Cardiology

## 2018-08-17 NOTE — Progress Notes (Addendum)
z    Cardiology Office Note   Date:  08/18/2018   ID:  Tanner Exon., DOB 04-09-1941, MRN [id number]  PCP:  Shirline Frees, MD  Cardiologist:   No primary care provider on file.   Chief Complaint  Patient presents with  . Atrial Fibrillation      History of Present Illness: Tanner W Jervon Ream. is a 77 y.o. male who presents for evaluation of atrial fib.  He was seen by Dr. Wynonia Lawman.  He is new to me and this practice. He had DCCV.  He apparently had an echocardiogram and I do not have these results at this point.  He had a POET (Plain Old Exercise Treadmill) which he reports was normal.  His atrial fibrillation was found when he was referred to Dr. Wynonia Lawman because of aortic atherosclerosis noted on his CT.  The CT was done because of some stable pulmonary nodules.  He actually does very well.  He is active taking care of some horses and gets at least 30 minutes of physical activity most days of the week. The patient denies any new symptoms such as chest discomfort, neck or arm discomfort. There has been no new shortness of breath, PND or orthopnea. There have been no reported palpitations, presyncope or syncope.  He tolerates anticoagulation.  He does not notice any palpitations, presyncope or syncope.  He has no shortness of breath, PND or orthopnea.   Past Medical History:  Diagnosis Date  . Arthritis    hands, "joints"  . Atrial fibrillation ([place])   . Dysrhythmia    pt. reports that Dr. Kenton Kingfisher (PCP) has done ekg's periodically for once seeing a "blip" on the tracing.   Marland Kitchen GERD (gastroesophageal reflux disease)   . History of skin cancer   . Prostate cancer [facility])    also- clear margins on melanoma - removed 2013  . Skin cancer    melanoma    Past Surgical History:  Procedure Laterality Date  . CARDIOVERSION N/A 05/30/2016   Procedure: CARDIOVERSION;  Surgeon: Jacolyn Reedy, MD;  Location[facility] ENDOSCOPY;  Service: Cardiovascular;  Laterality: N/A;  . COLONOSCOPY  2014     POLYP REMOVED  . INGUINAL HERNIA REPAIR Right 08/19/2015   Procedure: RIGHT INGUINAL HERNIA REPAIR;  Surgeon: [name], MD;  Location: [place];  Service: General;  Laterality: Right;  . INSERTION OF MESH Right 08/19/2015   Procedure: INSERTION OF MESH;  Surgeon: [name], MD;  Location: [place];  Service: General;  Laterality: Right;  . LYMPHADENECTOMY Bilateral 11/09/2013   Procedure: LYMPHADENECTOMY;  Surgeon: [name], MD;  Location: [facility] ORS;  Service: Urology;  Laterality: Bilateral;  . MELANOMA EXCISION  2012   CHEST  . ROBOT ASSISTED LAPAROSCOPIC RADICAL PROSTATECTOMY N/A 11/09/2013   Procedure: ROBOTIC ASSISTED LAPAROSCOPIC RADICAL PROSTATECTOMY LEVEL 2;  Surgeon: [name], MD;  Location: [facility];  Service: Urology;  Laterality: N/A;  . TONSILLECTOMY       Current Outpatient Medications  Medication Sig Dispense Refill  . acetaminophen (TYLENOL) 325 MG tablet Take 650 mg by mouth every 6 (six) hours as needed for mild pain or headache.     . Coenzyme Q10 (CO Q 10) 100 MG CAPS Take 300 mg by mouth daily.     . Cyanocobalamin (B-12) 1000 MCG TBCR Take 1 tablet by mouth daily.  3  . diclofenac sodium (VOLTAREN) 1 % GEL apply 2 grams PER upper extremity joint OR 4 grams TO lower extremity joint every  SIX hours  5  . ELIQUIS 5 MG TABS tablet Take 1 tablet by mouth 2 (two) times daily.    . folic acid (FOLVITE) 1 MG tablet Take 1 mg by mouth daily.  3  . Glucosamine-Chondroitin (GLUCOSAMINE CHONDR COMPLEX PO) Take 1 tablet by mouth 3 (three) times daily.     . Multiple Vitamins-Minerals (MULTIVITAMIN WITH MINERALS) tablet Take 1 tablet by mouth daily.    . sodium chloride (OCEAN) 0.65 % SOLN nasal spray Place 1 spray into both nostrils 2 (two) times daily as needed for congestion.    . vitamin C (ASCORBIC ACID) 500 MG tablet Take 500 mg by mouth daily.     No current facility-administered medications for this visit.     Allergies:   Chicken allergy; Meloxicam; and  Poultry meal    Social History:  The patient  reports that he quit smoking about 49 years ago. His smoking use included cigarettes. He has a 20.00 pack-year smoking history. He has never used smokeless tobacco. He reports that he drinks alcohol. He reports that he does not use drugs.   Family History:  The patient's family history includes Cancer in his father, maternal aunt, maternal uncle, and mother.    ROS:  Please see the history of present illness.   Otherwise, review of systems are positive for none.   All other systems are reviewed and negative.    PHYSICAL EXAM: VS:  BP 138/78   Pulse (!) 56   Ht 5\' 11"  (1.803 m)   Wt 223 lb 9.6 oz (101.4 kg)   BMI 31.19 kg/m  , BMI Body mass index is 31.19 kg/m. GENERAL:  Well appearing HEENT:  Pupils equal round and reactive, fundi not visualized, oral mucosa unremarkable NECK:  No jugular venous distention, waveform within normal limits, carotid upstroke brisk and symmetric, no bruits, no thyromegaly LYMPHATICS:  No cervical, inguinal adenopathy LUNGS:  Clear to auscultation bilaterally BACK:  No CVA tenderness CHEST:  Unremarkable HEART:  PMI not displaced or sustained,S1 and S2 within normal limits, no S3,  no clicks, no rubs, no murmurs, irregular ABD:  Flat, positive bowel sounds normal in frequency in pitch, no bruits, no rebound, no guarding, no midline pulsatile mass, no hepatomegaly, no splenomegaly EXT:  2 plus pulses throughout, no edema, no cyanosis no clubbing SKIN:  No rashes no nodules NEURO:  Cranial nerves II through XII grossly intact, motor grossly intact throughout PSYCH:  Cognitively intact, oriented to person place and time    EKG:  EKG is ordered today. The ekg ordered today demonstrates atrial fibrillation, rate 56, axis within normal limits, intervals within normal limits, no acute ST-T wave changes.   Recent Labs: No results found for requested labs within last 8760 hours.    Lipid Panel No results  found for: CHOL, TRIG, HDL, CHOLHDL, VLDL, LDLCALC, LDLDIRECT    Wt Readings from Last 3 Encounters:  08/18/18 223 lb 9.6 oz (101.4 kg)  08/12/16 218 lb (98.9 kg)  02/09/16 219 lb 4.8 oz (99.5 kg)      Other studies Reviewed: Additional studies/ records that were reviewed today include: Hospital records, cardioversion. Review of the above records demonstrates:  Please see elsewhere in the note.     ASSESSMENT AND PLAN:  PERSISTENT ATRIAL FIB:   He is status post cardioversion.  He is tolerating this well.  He tolerates anticoagulation. Mr. Tanner Richardson. has a CHA2DS2 - VASc score of 2.  I agree with continued rate  management and anticoagulation rather than rhythm management.  We had long discussion about this.  No change in therapy is indicated.  DYSLIPIDEMIA:   LDL is 124 with an HDL of 84.  He should adopt the Mediterranean diet and get his cholesterol checked in about 3 months.  If there is not significant improvement in his LDL I would suggest a statin.  AORTIC ATHEROSCLEROSIS:   He needs aggressive risk reduction with diet as above.  I will follow-up with her lipids as above.   ADDENDUM: After the patient left the office I did get an echocardiogram result that demonstrated moderate left ventricular hypertrophy.  There was moderate left atrial enlargement.  I will follow this clinically and with repeat studies in the future.  Current medicines are reviewed at length with the patient today.  The patient does not have concerns regarding medicines.  The following changes have been made:  no change  Labs/ tests ordered today include: None  Orders Placed This Encounter  Procedures  . EKG 12-Lead     Disposition:   FU with me in one year.     Signed, Minus Breeding, MD  08/18/2018 11:37 AM    [facility]

## 2018-08-18 ENCOUNTER — Encounter: Payer: Self-pay | Admitting: Cardiology

## 2018-08-18 ENCOUNTER — Ambulatory Visit: Payer: Federal, State, Local not specified - PPO | Admitting: Cardiology

## 2018-08-18 VITALS — BP 138/78 | HR 56 | Ht 71.0 in | Wt 223.6 lb

## 2018-08-18 DIAGNOSIS — I4821 Permanent atrial fibrillation: Secondary | ICD-10-CM | POA: Diagnosis not present

## 2018-08-18 DIAGNOSIS — E785 Hyperlipidemia, unspecified: Secondary | ICD-10-CM | POA: Diagnosis not present

## 2018-08-18 NOTE — Patient Instructions (Addendum)
Medication Instructions:  Continue current medications  If you need a refill on your cardiac medications before your next appointment, please call your pharmacy.  Labwork: None Ordered   If you have labs (blood work) drawn today and your tests are completely normal, you will receive your results only by: Marland Kitchen MyChart Message (if you have MyChart) OR . A paper copy in the mail If you have any lab test that is abnormal or we need to change your treatment, we will call you to review the results.  Testing/Procedures: None Ordered  Follow-Up: You will need a follow up appointment in 1 Year.  Please call our office 2 months in advance([phone]) to schedule the appointment.  You may see  DR Percival Spanish or one of the following Advanced Practice Providers on your designated Care Team:   . Jory Sims, DNP, ANP . Rhonda Barrett, PA-C .  Marland Kitchen Kerin Ransom, PA-C . Daleen Snook Kroeger, PA-C . Sande Rives, PA-C .  Marland Kitchen Almyra Deforest, PA-C . Fabian Sharp, PA-C  At [facility], you and your health needs are our priority.  As part of our continuing mission to provide you with exceptional heart care, we have created designated Provider Care Teams.  These Care Teams include your primary Cardiologist (physician) and Advanced Practice Providers (APPs -  Physician Assistants and Nurse Practitioners) who all work together to provide you with the care you need, when you need it.   Thank you for choosing [facility] at [facility]!!       Mediterranean Diet A Mediterranean diet refers to food and lifestyle choices that are based on the traditions of countries located on the [employer]. This way of eating has been shown to help prevent certain conditions and improve outcomes for people who have chronic diseases, like kidney disease and heart disease. What are tips for following this plan? Lifestyle  Cook and eat meals together with your family, when possible.  Drink enough fluid to keep your urine  clear or pale yellow.  Be physically active every day. This includes: ? Aerobic exercise like running or swimming. ? Leisure activities like gardening, walking, or housework.  Get 7-8 hours of sleep each night.  If recommended by your health care provider, drink red wine in moderation. This means 1 glass a day for nonpregnant women and 2 glasses a day for men. A glass of wine equals 5 oz (150 mL). Reading food labels  Check the serving size of packaged foods. For foods such as rice and pasta, the serving size refers to the amount of cooked product, not dry.  Check the total fat in packaged foods. Avoid foods that have saturated fat or trans fats.  Check the ingredients list for added sugars, such as corn syrup. Shopping  At the grocery store, buy most of your food from the areas near the walls of the store. This includes: ? Fresh fruits and vegetables (produce). ? Grains, beans, nuts, and seeds. Some of these may be available in unpackaged forms or large amounts (in bulk). ? Fresh seafood. ? Poultry and eggs. ? Low-fat dairy products.  Buy whole ingredients instead of prepackaged foods.  Buy fresh fruits and vegetables in-season from local farmers markets.  Buy frozen fruits and vegetables in resealable bags.  If you do not have access to quality fresh seafood, buy precooked frozen shrimp or canned fish, such as tuna, salmon, or sardines.  Buy small amounts of raw or cooked vegetables, salads, or olives from the deli or salad bar  at your store.  Stock your pantry so you always have certain foods on hand, such as olive oil, canned tuna, canned tomatoes, rice, pasta, and beans. Cooking  Cook foods with extra-virgin olive oil instead of using butter or other vegetable oils.  Have meat as a side dish, and have vegetables or grains as your main dish. This means having meat in small portions or adding small amounts of meat to foods like pasta or stew.  Use beans or vegetables  instead of meat in common dishes like chili or lasagna.  Experiment with different cooking methods. Try roasting or broiling vegetables instead of steaming or sauteing them.  Add frozen vegetables to soups, stews, pasta, or rice.  Add nuts or seeds for added healthy fat at each meal. You can add these to yogurt, salads, or vegetable dishes.  Marinate fish or vegetables using olive oil, lemon juice, garlic, and fresh herbs. Meal planning  Plan to eat 1 vegetarian meal one day each week. Try to work up to 2 vegetarian meals, if possible.  Eat seafood 2 or more times a week.  Have healthy snacks readily available, such as: ? Vegetable sticks with hummus. ? [place] yogurt. ? Fruit and nut trail mix.  Eat balanced meals throughout the week. This includes: ? Fruit: 2-3 servings a day ? Vegetables: 4-5 servings a day ? Low-fat dairy: 2 servings a day ? Fish, poultry, or lean meat: 1 serving a day ? Beans and legumes: 2 or more servings a week ? Nuts and seeds: 1-2 servings a day ? Whole grains: 6-8 servings a day ? Extra-virgin olive oil: 3-4 servings a day  Limit red meat and sweets to only a few servings a month What are my food choices?  Mediterranean diet ? Recommended ? Grains: Whole-grain pasta. Brown rice. Bulgar wheat. Polenta. Couscous. Whole-wheat bread. [place]. ? Vegetables: Artichokes. Beets. Broccoli. Cabbage. Carrots. Eggplant. Green beans. Chard. Kale. Spinach. Onions. Leeks. Peas. Squash. Tomatoes. Peppers. Radishes. ? Fruits: Apples. Apricots. Avocado. Berries. Bananas. Cherries. Dates. Figs. Grapes. Lemons. Melon. Oranges. Peaches. Plums. Pomegranate. ? Meats and other protein foods: Beans. Almonds. Sunflower seeds. Pine nuts. Peanuts. [place]. Salmon. Scallops. Shrimp. [place]. Tilapia. Clams. Oysters. Eggs. ? Dairy: Low-fat milk. Cheese. Greek yogurt. ? Beverages: Water. Red wine. Herbal tea. ? Fats and oils: Extra virgin olive oil. Avocado oil. Grape seed  oil. ? Sweets and desserts: [place] yogurt with honey. Baked apples. Poached pears. Trail mix. ? Seasoning and other foods: Basil. Cilantro. Coriander. Cumin. Mint. Parsley. Sage. Rosemary. Tarragon. Garlic. Oregano. Thyme. Pepper. Balsalmic vinegar. Tahini. Hummus. Tomato sauce. Olives. Mushrooms. ? Limit these ? Grains: Prepackaged pasta or rice dishes. Prepackaged cereal with added sugar. ? Vegetables: Deep fried potatoes (french fries). ? Fruits: Fruit canned in syrup. ? Meats and other protein foods: Beef. Pork. Lamb. Poultry with skin. Hot dogs. Berniece Salines. ? Dairy: Ice cream. Sour cream. Whole milk. ? Beverages: Juice. Sugar-sweetened soft drinks. Beer. Liquor and spirits. ? Fats and oils: Butter. Canola oil. Vegetable oil. Beef fat (tallow). Lard. ? Sweets and desserts: Cookies. Cakes. Pies. Candy. ? Seasoning and other foods: Mayonnaise. Premade sauces and marinades. ? The items listed may not be a complete list. Talk with your dietitian about what dietary choices are right for you. Summary  The Mediterranean diet includes both food and lifestyle choices.  Eat a variety of fresh fruits and vegetables, beans, nuts, seeds, and whole grains.  Limit the amount of red meat and sweets that you eat.  Talk  with your health care provider about whether it is safe for you to drink red wine in moderation. This means 1 glass a day for nonpregnant women and 2 glasses a day for men. A glass of wine equals 5 oz (150 mL). This information is not intended to replace advice given to you by your health care provider. Make sure you discuss any questions you have with your health care provider. Document Released: 06/07/2016 Document Revised: 07/10/2016 Document Reviewed: 06/07/2016 Elsevier Interactive Patient Education  [employer].

## 2018-10-07 ENCOUNTER — Other Ambulatory Visit: Payer: Self-pay | Admitting: Cardiology

## 2018-10-07 MED ORDER — ELIQUIS 5 MG PO TABS: 5.0000 mg | ORAL_TABLET | Freq: Two times a day (BID) | ORAL | 1 refills | Status: DC

## 2018-10-07 NOTE — Telephone Encounter (Signed)
 *  STAT* If patient is at the pharmacy, call can be transferred to refill team.   1. Which medications need to be refilled? (please list name of each medication and dose if known) ELIQUIS 5 MG TABS tablet  2. Which pharmacy/location (including street and city if local pharmacy) is medication to be sent to? [facility]  3. Do they need a 30 day or 90 day supply? 30  Almost out

## 2018-11-10 ENCOUNTER — Other Ambulatory Visit: Payer: Self-pay | Admitting: Cardiology

## 2018-11-10 MED ORDER — ELIQUIS 5 MG PO TABS: 5.0000 mg | ORAL_TABLET | Freq: Two times a day (BID) | ORAL | 1 refills | Status: DC

## 2018-11-10 NOTE — Telephone Encounter (Signed)
New Message     *STAT* If patient is at the pharmacy, call can be transferred to refill team.   1. Which medications need to be refilled? (please list name of each medication and dose if known) Eliquis 5mg   2. Which pharmacy/location (including street and city if local pharmacy) is medication to be sent to? [facility] on [address]  3. Do they need a 30 day or 90 day supply? 30 supply supply    Patient will like someone to call him about issue.

## 2019-02-05 ENCOUNTER — Other Ambulatory Visit: Payer: Self-pay | Admitting: Family Medicine

## 2019-02-05 DIAGNOSIS — M7989 Other specified soft tissue disorders: Secondary | ICD-10-CM

## 2019-02-09 ENCOUNTER — Other Ambulatory Visit: Payer: Self-pay | Admitting: Family Medicine

## 2019-02-09 DIAGNOSIS — M7989 Other specified soft tissue disorders: Secondary | ICD-10-CM

## 2019-02-12 ENCOUNTER — Ambulatory Visit
Admission: RE | Admit: 2019-02-12 | Discharge: 2019-02-12 | Disposition: A | Discharge status: Home / Self Care | Payer: Federal, State, Local not specified - PPO | Source: Ambulatory Visit | Attending: Family Medicine | Admitting: Family Medicine

## 2019-02-12 ENCOUNTER — Other Ambulatory Visit: Payer: Self-pay

## 2019-02-12 DIAGNOSIS — M7989 Other specified soft tissue disorders: Secondary | ICD-10-CM

## 2019-05-11 ENCOUNTER — Other Ambulatory Visit: Payer: Self-pay | Admitting: Cardiology

## 2019-08-09 DIAGNOSIS — I700 Atherosclerosis of aorta: Secondary | ICD-10-CM | POA: Insufficient documentation

## 2019-08-09 DIAGNOSIS — I4821 Permanent atrial fibrillation: Secondary | ICD-10-CM | POA: Insufficient documentation

## 2019-08-09 DIAGNOSIS — E785 Hyperlipidemia, unspecified: Secondary | ICD-10-CM | POA: Insufficient documentation

## 2019-08-09 DIAGNOSIS — I517 Cardiomegaly: Secondary | ICD-10-CM | POA: Insufficient documentation

## 2019-08-09 NOTE — Progress Notes (Signed)
z    Cardiology Office Note   Date:  08/10/2019   ID:  Tanner Fekete., DOB Sep 09, 1941, MRN [id number]  PCP:  Shirline Frees, MD  Cardiologist:   No primary care provider on file.   Chief Complaint  Patient presents with  . Atrial Fibrillation     History of Present Illness: Tanner W Cainen Ricco. is a 78 y.o. male who presents for evaluation of atrial fib.  He was seen by Dr. Wynonia Lawman.   He had DCCV.  He apparently had an echocardiogram and I do not have these results at this point.  He had a POET (Plain Old Exercise Treadmill) which he reports was normal.  His atrial fibrillation was found when he was referred to Dr. Wynonia Lawman because of aortic atherosclerosis noted on his CT.    Patient denies any cardiovascular symptoms.  He still works with his horses.  He has physical activity with this. The patient denies any new symptoms such as chest discomfort, neck or arm discomfort. There has been no new shortness of breath, PND or orthopnea. There have been no reported palpitations, presyncope or syncope.  He does not notice his atrial fibrillation.   Past Medical History:  Diagnosis Date  . Arthritis    hands, "joints"  . Atrial fibrillation ([place])   . Dysrhythmia    pt. reports that Dr. Kenton Kingfisher (PCP) has done ekg's periodically for once seeing a "blip" on the tracing.   Marland Kitchen GERD (gastroesophageal reflux disease)   . History of skin cancer   . Prostate cancer [facility])    also- clear margins on melanoma - removed 2013  . Skin cancer    melanoma    Past Surgical History:  Procedure Laterality Date  . CARDIOVERSION N/A 05/30/2016   Procedure: CARDIOVERSION;  Surgeon: Jacolyn Reedy, MD;  Location[facility] ENDOSCOPY;  Service: Cardiovascular;  Laterality: N/A;  . COLONOSCOPY  2014   POLYP REMOVED  . INGUINAL HERNIA REPAIR Right 08/19/2015   Procedure: RIGHT INGUINAL HERNIA REPAIR;  Surgeon: [name], MD;  Location: [place];  Service: General;  Laterality: Right;  . INSERTION OF MESH  Right 08/19/2015   Procedure: INSERTION OF MESH;  Surgeon: [name], MD;  Location: [place];  Service: General;  Laterality: Right;  . LYMPHADENECTOMY Bilateral 11/09/2013   Procedure: LYMPHADENECTOMY;  Surgeon: [name], MD;  Location: [facility];  Service: Urology;  Laterality: Bilateral;  . MELANOMA EXCISION  2012   CHEST  . ROBOT ASSISTED LAPAROSCOPIC RADICAL PROSTATECTOMY N/A 11/09/2013   Procedure: ROBOTIC ASSISTED LAPAROSCOPIC RADICAL PROSTATECTOMY LEVEL 2;  Surgeon: [name], MD;  Location: [facility];  Service: Urology;  Laterality: N/A;  . TONSILLECTOMY       Current Outpatient Medications  Medication Sig Dispense Refill  . acetaminophen (TYLENOL) 325 MG tablet Take 650 mg by mouth every 6 (six) hours as needed for mild pain or headache.     . Coenzyme Q10 (CO Q 10) 100 MG CAPS Take 300 mg by mouth daily.     . Cyanocobalamin (B-12) 1000 MCG TBCR Take 1 tablet by mouth daily.  3  . diclofenac sodium (VOLTAREN) 1 % GEL apply 2 grams PER upper extremity joint OR 4 grams TO lower extremity joint every SIX hours  5  . ELIQUIS 5 MG TABS tablet TAKE 1 TABLET BY MOUTH 2 TIMES DAILY 99991111 tablet 1  . folic acid (FOLVITE) 1 MG tablet Take 1 mg by mouth daily.  3  . Glucosamine-Chondroitin (GLUCOSAMINE CHONDR  COMPLEX PO) Take 1 tablet by mouth 3 (three) times daily.     . Multiple Vitamins-Minerals (MULTIVITAMIN WITH MINERALS) tablet Take 1 tablet by mouth daily.    . sodium chloride (OCEAN) 0.65 % SOLN nasal spray Place 1 spray into both nostrils 2 (two) times daily as needed for congestion.    Marland Kitchen Specialty Vitamins Products (ECHINACEA C COMPLETE PO) Take by mouth.    . vitamin C (ASCORBIC ACID) 500 MG tablet Take 500 mg by mouth daily.     No current facility-administered medications for this visit.     Allergies:   Meloxicam and Poultry meal    ROS:  Please see the history of present illness.   Otherwise, review of systems are positive for none.   All other systems are reviewed and  negative.    PHYSICAL EXAM: VS:  BP (!) 155/85   Pulse 64   Temp (!) 97 F (36.1 C)   Ht 5\' 11"  (1.803 m)   Wt 227 lb 3.2 oz (103.1 kg)   SpO2 95%   BMI 31.69 kg/m  , BMI Body mass index is 31.69 kg/m. GENERAL:  Well appearing NECK:  No jugular venous distention, waveform within normal limits, carotid upstroke brisk and symmetric, no bruits, no thyromegaly LUNGS:  Clear to auscultation bilaterally CHEST:  Unremarkable HEART:  PMI not displaced or sustained,S1 and S2 within normal limits, no S3, no clicks, no rubs, no murmurs, irregular ABD:  Flat, positive bowel sounds normal in frequency in pitch, no bruits, no rebound, no guarding, no midline pulsatile mass, no hepatomegaly, no splenomegaly EXT:  2 plus pulses throughout, no edema, no cyanosis no clubbing    EKG:  EKG is  ordered today. The ekg ordered today demonstrates atrial fibrillation, rate 67, axis within normal limits, intervals within normal limits, no acute ST-T wave changes.  Positive premature ventricular contractions versus aberrant conduction   Recent Labs: No results found for requested labs within last 8760 hours.    Lipid Panel No results found for: CHOL, TRIG, HDL, CHOLHDL, VLDL, LDLCALC, LDLDIRECT    Wt Readings from Last 3 Encounters:  08/10/19 227 lb 3.2 oz (103.1 kg)  08/18/18 223 lb 9.6 oz (101.4 kg)  08/12/16 218 lb (98.9 kg)      Other studies Reviewed: Additional studies/ records that were reviewed today include: Labs Review of the above records demonstrates:  See elsewhere  ASSESSMENT AND PLAN:  PERMANENT ATRIAL FIB:   Mr. Tanner Richardson. has a CHA2DS2 - VASc score of 2.  He tolerates this rhythm.  He tolerates anticoagulation.  No change in therapy.   DYSLIPIDEMIA:   LDL is 114 with an HDL of 74.  Last year it was 124 with an HDL of 84.  We had a conversation that he was going to pursue the [place] rather than be on meds.  He did not do that.  We had this conversation  again today.  He really wants to double.try the Mediterranean diet and probably should get repeat lipid testing in 20 years and and if not he would not agree to start a statin which I think is indicated given his aortic atherosclerosis.    AORTIC ATHEROSCLEROSIS:    We will attempt risk reduction as above.   LVH:   This was moderate on echo.  His blood pressures not at target.  We had a discussion about this.  He does not take his blood pressure readings at home and has not had  a blood pressure cuff.  We wrote down the name of a brand we would like him to try and keep a blood pressure diary.  We gave him instructions for My Chart.  He was promised to send me information the next month and he might need a blood pressure medication.  In the meantime he will try weight loss with diet and decrease salt.    Current medicines are reviewed at length with the patient today.  The patient does not have concerns regarding medicines.  The following changes have been made:  None  Labs/ tests ordered today include: None   Orders Placed This Encounter  Procedures  . EKG 12-Lead     Disposition:   FU with me in one year.Ronnell Guadalajara, MD  08/10/2019 4:11 PM    [facility]

## 2019-08-10 ENCOUNTER — Ambulatory Visit: Payer: Federal, State, Local not specified - PPO | Admitting: Cardiology

## 2019-08-10 ENCOUNTER — Other Ambulatory Visit: Payer: Self-pay

## 2019-08-10 ENCOUNTER — Encounter: Payer: Self-pay | Admitting: Cardiology

## 2019-08-10 VITALS — BP 155/85 | HR 64 | Temp 97.0°F | Ht 71.0 in | Wt 227.2 lb

## 2019-08-10 DIAGNOSIS — I4821 Permanent atrial fibrillation: Secondary | ICD-10-CM | POA: Diagnosis not present

## 2019-08-10 DIAGNOSIS — I700 Atherosclerosis of aorta: Secondary | ICD-10-CM | POA: Diagnosis not present

## 2019-08-10 DIAGNOSIS — I517 Cardiomegaly: Secondary | ICD-10-CM | POA: Diagnosis not present

## 2019-08-10 DIAGNOSIS — E785 Hyperlipidemia, unspecified: Secondary | ICD-10-CM

## 2019-08-10 NOTE — Patient Instructions (Addendum)
Medication Instructions:  Your physician recommends that you continue on your current medications as directed. Please refer to the Current Medication list given to you today.  If you need a refill on your cardiac medications before your next appointment, please call your pharmacy.   Lab work: NONE  Testing/Procedures: NONE  Follow-Up: At [facility], you and your health needs are our priority.  As part of our continuing mission to provide you with exceptional heart care, we have created designated Provider Care Teams.  These Care Teams include your primary Cardiologist (physician) and Advanced Practice Providers (APPs -  Physician Assistants and Nurse Practitioners) who all work together to provide you with the care you need, when you need it. You will need a follow up appointment in 12 months.  Please call our office 2 months in advance to schedule this appointment.  You may see Dr. Percival Spanish or one of the following Advanced Practice Providers on your designated Care Team:   Rosaria Ferries, PA-C Jory Sims, DNP, ANP  Any Other Special Instructions Will Be Listed Below (If Applicable). OMRON

## 2019-10-07 ENCOUNTER — Other Ambulatory Visit: Payer: Self-pay | Admitting: Cardiology

## 2019-11-27 ENCOUNTER — Ambulatory Visit: Payer: Federal, State, Local not specified - PPO

## 2019-12-12 ENCOUNTER — Ambulatory Visit: Payer: Federal, State, Local not specified - PPO

## 2020-08-14 NOTE — Progress Notes (Signed)
z    Cardiology Office Note   Date:  08/15/2020   ID:  Tanner Exon., DOB 1941-04-30, MRN [id number]  PCP:  Tanner Frees, MD  Cardiologist:   No primary care provider on file.   Chief Complaint  Patient presents with  . Atrial Fibrillation     History of Present Illness: Tanner W Tanner Richardson. is a 79 y.o. male who presents for evaluation of atrial fib.  He was seen by Tanner Richardson.   He had DCCV.  Since I saw him he has done well. The patient denies any new symptoms such as chest discomfort, neck or arm discomfort. There has been no new shortness of breath, PND or orthopnea. There have been no reported palpitations, presyncope or syncope. 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  He exercises. The patient denies any new symptoms such as chest discomfort, neck or arm discomfort. There has been no new shortness of breath, PND or orthopnea. There have been no reported palpitations, presyncope or syncope.   Of note he lost weight 17 pounds by following the Mediterranean diet.  He is going to continue on this.   Past Medical History:  Diagnosis Date  . Arthritis    hands, "joints"  . Atrial fibrillation ([place])   . Dysrhythmia    pt. reports that Dr. Kenton Kingfisher (PCP) has done ekg's periodically for once seeing a "blip" on the tracing.   Marland Kitchen GERD (gastroesophageal reflux disease)   . History of skin cancer   . Prostate cancer [facility])    also- clear margins on melanoma - removed 2013  . Skin cancer    melanoma    Past Surgical History:  Procedure Laterality Date  . CARDIOVERSION N/A 05/30/2016   Procedure: CARDIOVERSION;  Surgeon: Jacolyn Reedy, MD;  Location: [facility] ENDOSCOPY;  Service: Cardiovascular;  Laterality: N/A;  . COLONOSCOPY  2014   POLYP REMOVED  . INGUINAL HERNIA REPAIR Right 08/19/2015   Procedure: RIGHT INGUINAL HERNIA REPAIR;  Surgeon: [name], MD;  Location: [place];  Service: General;  Laterality: Right;  . INSERTION OF MESH Right 08/19/2015   Procedure: INSERTION OF MESH;   Surgeon: [name], MD;  Location: [place];  Service: General;  Laterality: Right;  . LYMPHADENECTOMY Bilateral 11/09/2013   Procedure: LYMPHADENECTOMY;  Surgeon: [name], MD;  Location: [facility];  Service: Urology;  Laterality: Bilateral;  . MELANOMA EXCISION  2012   CHEST  . ROBOT ASSISTED LAPAROSCOPIC RADICAL PROSTATECTOMY N/A 11/09/2013   Procedure: ROBOTIC ASSISTED LAPAROSCOPIC RADICAL PROSTATECTOMY LEVEL 2;  Surgeon: [name], MD;  Location: [facility];  Service: Urology;  Laterality: N/A;  . TONSILLECTOMY       Current Outpatient Medications  Medication Sig Dispense Refill  . acetaminophen (TYLENOL) 325 MG tablet Take 650 mg by mouth every 6 (six) hours as needed for mild pain or headache.     . Coenzyme Q10 (CO Q 10) 100 MG CAPS Take 300 mg by mouth daily.     . Cyanocobalamin (B-12) 1000 MCG TBCR Take 1 tablet by mouth daily.  3  . diclofenac sodium (VOLTAREN) 1 % GEL apply 2 grams PER upper extremity joint OR 4 grams TO lower extremity joint every SIX hours  5  . ELIQUIS 5 MG TABS tablet TAKE 1 TABLET BY MOUTH 2 TIMES DAILY 409 tablet 3  . folic acid (FOLVITE) 1 MG tablet Take 1 mg by mouth daily.  3  . Glucosamine-Chondroitin (GLUCOSAMINE CHONDR COMPLEX PO) Take 1 tablet by mouth 3 (three)  times daily.     . Multiple Vitamins-Minerals (MULTIVITAMIN WITH MINERALS) tablet Take 1 tablet by mouth daily.    . Red Yeast Rice 600 MG TABS Take 600 mg by mouth in the morning, at noon, in the evening, and at bedtime.    . sodium chloride (OCEAN) 0.65 % SOLN nasal spray Place 1 spray into both nostrils 2 (two) times daily as needed for congestion.    Marland Kitchen Specialty Vitamins Products (ECHINACEA C COMPLETE PO) Take 760 mg by mouth in the morning and at bedtime.     . vitamin C (ASCORBIC ACID) 500 MG tablet Take 500 mg by mouth daily.     No current facility-administered medications for this visit.    Allergies:   Meloxicam and Poultry meal    ROS:  Please see the history of present  illness.   Otherwise, review of systems are positive for none.   All other systems are reviewed and negative.    PHYSICAL EXAM: VS:  BP (!) 152/88   Pulse (!) 59   Ht 5\' 11"  (1.803 m)   Wt 210 lb (95.3 kg)   SpO2 98%   BMI 29.29 kg/m  , BMI Body mass index is 29.29 kg/m. GENERAL:  Well appearing NECK:  No jugular venous distention, waveform within normal limits, carotid upstroke brisk and symmetric, no bruits, no thyromegaly LUNGS:  Clear to auscultation bilaterally CHEST:  Unremarkable HEART:  PMI not displaced or sustained,S1 and S2 within normal limits, no S3, no clicks, no rubs, no murmurs, irregular ABD:  Flat, positive bowel sounds normal in frequency in pitch, no bruits, no rebound, no guarding, no midline pulsatile mass, no hepatomegaly, no splenomegaly EXT:  2 plus pulses throughout, no edema, no cyanosis no clubbing   EKG:  EKG is  ordered today. The ekg ordered today demonstrates atrial fibrillation, rate 59, axis within normal limits, intervals within normal limits, no acute ST-T wave changes.    Recent Labs: No results found for requested labs within last 8760 hours.    Lipid Panel No results found for: CHOL, TRIG, HDL, CHOLHDL, VLDL, LDLCALC, LDLDIRECT    Wt Readings from Last 3 Encounters:  08/15/20 210 lb (95.3 kg)  08/10/19 227 lb 3.2 oz (103.1 kg)  08/18/18 223 lb 9.6 oz (101.4 kg)      Other studies Reviewed: Additional studies/ records that were reviewed today include: Labs Review of the above records demonstrates:  See elsewhere  ASSESSMENT AND PLAN:  PERMANENT ATRIAL FIB:   Mr. Tanner Richardson. has a CHA2DS2 - VASc score of 2.  He tolerates anticoagulation and rate control.  No change in therapy.   DYSLIPIDEMIA:   LDL is 122 but his HDL was 75.  He is going to continue to follow the [place] and we can check this again next year.   AORTIC ATHEROSCLEROSIS:    He is exercising and watching his diet and we will continue with primary  risk reduction.   LVH:   This was moderate on echo and related likely to his hypertension.  His blood pressures are well controlled at home.  He is going to keep a blood pressure diary and I am not going to add therapy as he is going to continue to lose weight and I think he is at target with his blood pressures at home.   Current medicines are reviewed at length with the patient today.  The patient does not have concerns regarding medicines.  The following changes  have been made: None  Labs/ tests ordered today include: None  Orders Placed This Encounter  Procedures  . EKG 12-Lead     Disposition:   FU with me in 1 year   Signed, Minus Breeding, MD  08/15/2020 10:16 AM    [facility]

## 2020-08-15 ENCOUNTER — Ambulatory Visit (INDEPENDENT_AMBULATORY_CARE_PROVIDER_SITE_OTHER): Payer: Federal, State, Local not specified - PPO | Admitting: Cardiology

## 2020-08-15 ENCOUNTER — Encounter: Payer: Self-pay | Admitting: Cardiology

## 2020-08-15 ENCOUNTER — Other Ambulatory Visit: Payer: Self-pay

## 2020-08-15 VITALS — BP 152/88 | HR 59 | Ht 71.0 in | Wt 210.0 lb

## 2020-08-15 DIAGNOSIS — I517 Cardiomegaly: Secondary | ICD-10-CM | POA: Diagnosis not present

## 2020-08-15 DIAGNOSIS — I700 Atherosclerosis of aorta: Secondary | ICD-10-CM | POA: Diagnosis not present

## 2020-08-15 DIAGNOSIS — I4821 Permanent atrial fibrillation: Secondary | ICD-10-CM | POA: Diagnosis not present

## 2020-08-15 DIAGNOSIS — E785 Hyperlipidemia, unspecified: Secondary | ICD-10-CM | POA: Diagnosis not present

## 2020-08-15 NOTE — Patient Instructions (Signed)

## 2020-09-12 ENCOUNTER — Other Ambulatory Visit: Payer: Self-pay | Admitting: Cardiology

## 2020-09-12 NOTE — Telephone Encounter (Signed)
Rx has been sent to the pharmacy electronically. ° °

## 2021-07-30 NOTE — Progress Notes (Signed)
z    Cardiology Office Note   Date:  07/31/2021   ID:  Tanner Exon., DOB [birth year], MRN [id number]  PCP:  Shirline Frees, MD  Cardiologist:   None   Chief Complaint  Patient presents with   Atrial Fibrillation      History of Present Illness: Tanner Cybulski. is a 80 y.o. male who presents for evaluation of atrial fib.  He was seen by Dr. Wynonia Lawman.   He had DCCV.  Since I saw him he has done well.  He is riding horses.  He shovels stuff out of the stalls.  With this he denies any cardiovascular symptoms.  He does not notice his atrial fibrillation.  He is not having any chest pressure, neck or arm discomfort.  He is not having shortness of breath, PND or orthopnea.  He is lost total about 27 pounds with diet.  He does describe hematuria.  He has twice been seen about this and is told that he might have some radiation changes that are oozing blood.  He will noticed the bleeding and might on his own stop his Eliquis for a day.  He says he knows that this can happen with certain exercises or with a lot of riding in the saddle.  He can try to avoid that.   Past Medical History:  Diagnosis Date   Arthritis    hands, "joints"   Atrial fibrillation ([place])    Dysrhythmia    pt. reports that Dr. Kenton Kingfisher (PCP) has done ekg's periodically for once seeing a "blip" on the tracing.    GERD (gastroesophageal reflux disease)    History of skin cancer    Prostate cancer ([place])    also- clear margins on melanoma - removed 2013   Skin cancer    melanoma    Past Surgical History:  Procedure Laterality Date   CARDIOVERSION N/A 05/30/2016   Procedure: CARDIOVERSION;  Surgeon: Jacolyn Reedy, MD;  Location: [facility] ENDOSCOPY;  Service: Cardiovascular;  Laterality: N/A;   COLONOSCOPY  2014   POLYP REMOVED   INGUINAL HERNIA REPAIR Right 08/19/2015   Procedure: RIGHT INGUINAL HERNIA REPAIR;  Surgeon: [name], MD;  Location: [place];  Service: General;  Laterality: Right;   INSERTION OF  MESH Right 08/19/2015   Procedure: INSERTION OF MESH;  Surgeon: [name], MD;  Location: [place];  Service: General;  Laterality: Right;   LYMPHADENECTOMY Bilateral 11/09/2013   Procedure: Noel Journey;  Surgeon: [name], MD;  Location: [facility] ORS;  Service: Urology;  Laterality: Bilateral;   MELANOMA EXCISION  2012   CHEST   ROBOT ASSISTED LAPAROSCOPIC RADICAL PROSTATECTOMY N/A 11/09/2013   Procedure: ROBOTIC ASSISTED LAPAROSCOPIC RADICAL PROSTATECTOMY LEVEL 2;  Surgeon: [name], MD;  Location: [facility] ORS;  Service: Urology;  Laterality: N/A;   TONSILLECTOMY       Current Outpatient Medications  Medication Sig Dispense Refill   acetaminophen (TYLENOL) 325 MG tablet Take 650 mg by mouth every 6 (six) hours as needed for mild pain or headache.      Coenzyme Q10 (CO Q 10) 100 MG CAPS Take 300 mg by mouth daily.      Cyanocobalamin (B-12) 1000 MCG TBCR Take 1 tablet by mouth daily.  3   diclofenac sodium (VOLTAREN) 1 % GEL apply 2 grams PER upper extremity joint OR 4 grams TO lower extremity joint every SIX hours  5   ELIQUIS 5 MG TABS tablet TAKE 1 TABLET BY MOUTH 2 TIMES DAILY  696 tablet 3   folic acid (FOLVITE) 1 MG tablet Take 1 mg by mouth daily.  3   Glucosamine-Chondroitin (GLUCOSAMINE CHONDR COMPLEX PO) Take 1 tablet by mouth 3 (three) times daily.      Multiple Vitamins-Minerals (MULTIVITAMIN WITH MINERALS) tablet Take 1 tablet by mouth daily.     Red Yeast Rice 600 MG TABS Take 600 mg by mouth in the morning, at noon, in the evening, and at bedtime.     sodium chloride (OCEAN) 0.65 % SOLN nasal spray Place 1 spray into both nostrils 2 (two) times daily as needed for congestion.     Specialty Vitamins Products (ECHINACEA C COMPLETE PO) Take 760 mg by mouth in the morning and at bedtime.      vitamin C (ASCORBIC ACID) 500 MG tablet Take 500 mg by mouth daily.     No current facility-administered medications for this visit.    Allergies:   Meloxicam and Poultry meal    ROS:   Please see the history of present illness.   Otherwise, review of systems are positive for none.   All other systems are reviewed and negative.    PHYSICAL EXAM: VS:  BP (!) 150/76   Pulse (!) 54   Ht 5\' 11"  (1.803 m)   Wt 201 lb 12.8 oz (91.5 kg)   SpO2 96%   BMI 28.15 kg/m  , BMI Body mass index is 28.15 kg/m. GENERAL:  Well appearing NECK:  No jugular venous distention, waveform within normal limits, carotid upstroke brisk and symmetric, no bruits, no thyromegaly LUNGS:  Clear to auscultation bilaterally CHEST:  Unremarkable HEART:  PMI not displaced or sustained,S1 and S2 within normal limits, no S3,, no clicks, no rubs, no murmurs, irregular ABD:  Flat, positive bowel sounds normal in frequency in pitch, no bruits, no rebound, no guarding, no midline pulsatile mass, no hepatomegaly, no splenomegaly EXT:  2 plus pulses throughout, no edema, no cyanosis no clubbing   EKG:  EKG is  ordered today. The ekg ordered today demonstrates atrial fibrillation, rate 54, axis within normal limits, intervals within normal limits, no acute ST-T wave changes.     Recent Labs: No results found for requested labs within last 8760 hours.    Lipid Panel No results found for: CHOL, TRIG, HDL, CHOLHDL, VLDL, LDLCALC, LDLDIRECT    Wt Readings from Last 3 Encounters:  07/31/21 201 lb 12.8 oz (91.5 kg)  08/15/20 210 lb (95.3 kg)  08/10/19 227 lb 3.2 oz (103.1 kg)      Other studies Reviewed: Additional studies/ records that were reviewed today include: Labs Review of the above records demonstrates:  See elsewhere  ASSESSMENT AND PLAN:  PERMANENT ATRIAL FIB:   Mr. Tanner Richardson. has a CHA2DS2 - VASc score of 3.  We had a long discussion about [facility].  He knows there is an alternative to anticoagulation should his hematuria ever become problematic enough.   I do not have his CBC but he says he is not anemic.  He says he can try to avoid activities that bring on the hematuria.  He says  its not a lot and he would let me know if it increases and we think we need to discontinue Eliquis and moved to a device.   DYSLIPIDEMIA:   LDL is 112 which is down from 122 but his HDL was 79.  No change in therapy and he will continue with a Mediterranean diet.    AORTIC ATHEROSCLEROSIS: We are  continuing with risk reduction.  LVH:   This was moderate on echo in the past.  He is going to send me a blood pressure diary.  Blood pressure is elevated today.  I suspect he will need a change in therapy.    Current medicines are reviewed at length with the patient today.  The patient does not have concerns regarding medicines.  The following changes have been made: None  Labs/ tests ordered today include: None  Orders Placed This Encounter  Procedures   EKG 12-Lead      Disposition:   FU with me in onee year   Signed, Minus Breeding, MD  07/31/2021 10:13 AM    [place]

## 2021-07-31 ENCOUNTER — Encounter: Payer: Self-pay | Admitting: Cardiology

## 2021-07-31 ENCOUNTER — Other Ambulatory Visit: Payer: Self-pay

## 2021-07-31 ENCOUNTER — Ambulatory Visit: Payer: Federal, State, Local not specified - PPO | Admitting: Cardiology

## 2021-07-31 VITALS — BP 150/76 | HR 54 | Ht 71.0 in | Wt 201.8 lb

## 2021-07-31 DIAGNOSIS — I700 Atherosclerosis of aorta: Secondary | ICD-10-CM

## 2021-07-31 DIAGNOSIS — I4821 Permanent atrial fibrillation: Secondary | ICD-10-CM

## 2021-07-31 DIAGNOSIS — E785 Hyperlipidemia, unspecified: Secondary | ICD-10-CM

## 2021-07-31 DIAGNOSIS — I517 Cardiomegaly: Secondary | ICD-10-CM

## 2021-07-31 NOTE — Patient Instructions (Signed)
Medication Instructions:  Your Physician recommend you continue on your current medication as directed.    *If you need a refill on your cardiac medications before your next appointment, please call your pharmacy*   Lab Work: None ordered today   Testing/Procedures: None ordered today   Follow-Up: At [facility], you and your health needs are our priority.  As part of our continuing mission to provide you with exceptional heart care, we have created designated Provider Care Teams.  These Care Teams include your primary Cardiologist (physician) and Advanced Practice Providers (APPs -  Physician Assistants and Nurse Practitioners) who all work together to provide you with the care you need, when you need it.  We recommend signing up for the patient portal called "MyChart".  Sign up information is provided on this After Visit Summary.  MyChart is used to connect with patients for Virtual Visits (Telemedicine).  Patients are able to view lab/test results, encounter notes, upcoming appointments, etc.  Non-urgent messages can be sent to your provider as well.   To learn more about what you can do with MyChart, go to [email or web address].    Your next appointment:   1 year(s)  The format for your next appointment:   In Person  Provider:   Minus Breeding, MD   Other Instructions

## 2021-08-31 ENCOUNTER — Ambulatory Visit: Payer: Federal, State, Local not specified - PPO | Admitting: Cardiology

## 2021-09-12 ENCOUNTER — Other Ambulatory Visit: Payer: Self-pay | Admitting: Cardiology

## 2021-09-13 NOTE — Telephone Encounter (Signed)
Prescription refill request for Eliquis received. Indication:Afib Last office visit:10/22 Scr:0.6 Age: 80 Weight:91.5 kg  Prescription refilled

## 2022-02-14 ENCOUNTER — Other Ambulatory Visit: Payer: Self-pay | Admitting: Cardiology

## 2022-02-14 DIAGNOSIS — I4821 Permanent atrial fibrillation: Secondary | ICD-10-CM

## 2022-02-14 NOTE — Telephone Encounter (Signed)
Prescription refill request for Eliquis received. ?Indication: a fib ?Last office visit: 07/31/21 ?Scr: 0.65 ?Age: 81 ?Weight: 91kg ?

## 2022-02-21 ENCOUNTER — Other Ambulatory Visit: Payer: Self-pay | Admitting: Urology

## 2022-02-21 ENCOUNTER — Telehealth: Payer: Self-pay | Admitting: Cardiology

## 2022-02-21 NOTE — Telephone Encounter (Signed)
? ?  Pre-operative Risk Assessment  ?  ?Patient Name: Tanner Richardson.  ?DOB: 17-Nov-1940 ?MRN: [id number]  ? ?  ? ?Request for Surgical Clearance   ? ?Procedure:   Cystoscopy Bladder Biopsy Right Ureteroscopy with Right Urerural Stint Placement ? ?Date of Surgery:  Clearance 03/22/22                              ?   ?Surgeon:  Dr. [name] ?Surgeon's Group or Practice Name:  [facility] ?Phone number:  [phone] Ext [phone] ?Fax number:  [phone] ? ?4. What type of clearance is requested?  Medical or Cardiac Clearance only?  Pharmacy Clearance Only (Request is to hold medication only)?  Or Both?  Press F2 and select the clearance requested.  If both are needed, select both from the drop down list.     :1}  ?Type of Clearance Requested:   ?- Medical  ?- Pharmacy - Eliquis held 3 days prior to procedure ?  ?Type of Anesthesia:  General  ?  ?Additional requests/questions:  Please advise surgeon/provider what medications should be held. ? ?Signed, ?Belisicia T Harris   ?02/21/2022, 3:47 PM  ? ?

## 2022-02-22 NOTE — Telephone Encounter (Signed)
? ? ?  Name: Tanner Richardson.  ?DOB: 02-05-1941  ?MRN: [id number] ? ?Primary Cardiologist: None ? ? ?Preoperative team, please contact this patient and set up a phone call appointment for further preoperative risk assessment. Please obtain consent and complete medication review. Thank you for your help. ? ?I confirm that guidance regarding antiplatelet and oral anticoagulation therapy has been completed and, if necessary, noted below. ? ? ? ?Christell Faith, PA-C ?02/22/2022, 1:44 PM ?[facility] ?[address] ?[place], [place] [address] ? ? ?

## 2022-02-22 NOTE — Telephone Encounter (Signed)
Patient with diagnosis of afib on Eliquis for anticoagulation.   ? ?Procedure: Cystoscopy Bladder Biopsy Right Ureteroscopy with Right Urerural Stent Placement ?Date of procedure: 03/22/22 ? ?CHA2DS2-VASc Score = 4  ?This indicates a 4.8% annual risk of stroke. ?The patient's score is based upon: ?CHF History: 0 ?HTN History: 1 ?Diabetes History: 0 ?Stroke History: 0 ?Vascular Disease History: 1 ?Age Score: 2 ?Gender Score: 0 ?  ?CrCl >173m/min ?Plt 313K ? ?Per office protocol, patient can hold Eliquis for 3 days prior to procedure as requested. ?

## 2022-02-22 NOTE — Telephone Encounter (Signed)
Pharmacy, please review request to hold Eliquis (Afib). Patient will need tele visit.  ?

## 2022-02-23 ENCOUNTER — Telehealth: Payer: Self-pay | Admitting: *Deleted

## 2022-02-23 NOTE — Telephone Encounter (Signed)
Spoke with patient and scheduled him for a telehealth preop clearance appointment on 03/16/2022 at 9:00 AM. Consent is in and meds have been reviewed. Will fax to requesting surgeons office to make them aware. ?

## 2022-02-23 NOTE — Telephone Encounter (Signed)
?  Patient Consent for Virtual Visit  ? ? ?   ? ?Tanner Richardson. has provided verbal consent on 02/23/2022 for a virtual visit (video or telephone). ? ? ?CONSENT FOR VIRTUAL VISIT FOR:  Tanner Richardson.  ?By participating in this virtual visit I agree to the following: ? ?I hereby voluntarily request, consent and authorize [facility] and its employed or contracted physicians, physician assistants, nurse practitioners or other licensed health care professionals (the Practitioner), to provide me with telemedicine health care services (the ?Services") as deemed necessary by the treating Practitioner. I acknowledge and consent to receive the Services by the Practitioner via telemedicine. I understand that the telemedicine visit will involve communicating with the Practitioner through live audiovisual communication technology and the disclosure of certain medical information by electronic transmission. I acknowledge that I have been given the opportunity to request an in-person assessment or other available alternative prior to the telemedicine visit and am voluntarily participating in the telemedicine visit. ? ?I understand that I have the right to withhold or withdraw my consent to the use of telemedicine in the course of my care at any time, without affecting my right to future care or treatment, and that the Practitioner or I may terminate the telemedicine visit at any time. I understand that I have the right to inspect all information obtained and/or recorded in the course of the telemedicine visit and may receive copies of available information for a reasonable fee.  I understand that some of the potential risks of receiving the Services via telemedicine include:  ?Delay or interruption in medical evaluation due to technological equipment failure or disruption; ?Information transmitted may not be sufficient (e.g. poor resolution of images) to allow for appropriate medical decision making by the  Practitioner; and/or  ?In rare instances, security protocols could fail, causing a breach of personal health information. ? ?Furthermore, I acknowledge that it is my responsibility to provide information about my medical history, conditions and care that is complete and accurate to the best of my ability. I acknowledge that Practitioner's advice, recommendations, and/or decision may be based on factors not within their control, such as incomplete or inaccurate data provided by me or distortions of diagnostic images or specimens that may result from electronic transmissions. I understand that the practice of medicine is not an exact science and that Practitioner makes no warranties or guarantees regarding treatment outcomes. I acknowledge that a copy of this consent can be made available to me via my patient portal ([facility]), or I can request a printed copy by calling the office of [facility].   ? ?I understand that my insurance will be billed for this visit.  ? ?I have read or had this consent read to me. ?I understand the contents of this consent, which adequately explains the benefits and risks of the Services being provided via telemedicine.  ?I have been provided ample opportunity to ask questions regarding this consent and the Services and have had my questions answered to my satisfaction. ?I give my informed consent for the services to be provided through the use of telemedicine in my medical care ? ?  ?

## 2022-03-14 ENCOUNTER — Other Ambulatory Visit (HOSPITAL_COMMUNITY): Payer: Federal, State, Local not specified - PPO

## 2022-03-14 NOTE — Progress Notes (Addendum)
COVID Vaccine Completed: yes x3  Date of COVID positive in last 90 days: no  PCP - Shirline Frees, MD Cardiologist - Minus Breeding, MD  Need cardiac clearance, pt has phone call appointment at 0900 03/16/22  Chest x-ray - n/a EKG - 07/31/21 Epic Stress Test - 02/23/16 Epic ECHO - 02/13/16 Epic Cardiac Cath - n/a Pacemaker/ICD device last checked: n/a Spinal Cord Stimulator: n/a  Bowel Prep - no  Sleep Study - yes, no CPAP -   Fasting Blood Sugar - pre DM Checks Blood Sugar _____ times a day  Blood Thinner Instructions: Eliquis, hold 3 days Aspirin Instructions: Last Dose: 03/18/22 1800  Activity level: Can go up a flight of stairs and perform activities of daily living without stopping and without symptoms of chest pain or shortness of breath.    Anesthesia review: a fib, aortic atherosclerosis   Patient denies shortness of breath, fever, cough and chest pain at PAT appointment   Patient verbalized understanding of instructions that were given to them at the PAT appointment. Patient was also instructed that they will need to review over the PAT instructions again at home before surgery.

## 2022-03-14 NOTE — Patient Instructions (Addendum)
DUE TO COVID-19 ONLY TWO VISITORS  (aged 80 and older)  ARE ALLOWED TO COME WITH YOU AND STAY IN THE WAITING ROOM ONLY DURING PRE OP AND PROCEDURE.   **NO VISITORS ARE ALLOWED IN THE SHORT STAY AREA OR RECOVERY ROOM!!**   Your procedure is scheduled on: 03/22/22   Report to [facility] Main Entrance    Report to admitting at 11:15 AM   Call this number if you have problems the morning of surgery [phone]   Do not eat food :After Midnight.   After Midnight you may have the following liquids until 10:30 AM DAY OF SURGERY  Water Black Coffee (sugar ok, NO MILK/CREAM OR CREAMERS)  Tea (sugar ok, NO MILK/CREAM OR CREAMERS) regular and decaf                             Plain Jell-O (NO RED)                                           Fruit ices (not with fruit pulp, NO RED)                                     Popsicles (NO RED)                                                                  Juice: apple, WHITE grape, WHITE cranberry Sports drinks like Gatorade (NO RED) Clear broth(vegetable,chicken,beef)  FOLLOW BOWEL PREP AND ANY ADDITIONAL PRE OP INSTRUCTIONS YOU RECEIVED FROM YOUR SURGEON'S OFFICE!!!     Oral Hygiene is also important to reduce your risk of infection.                                    Remember - BRUSH YOUR TEETH THE MORNING OF SURGERY WITH YOUR REGULAR TOOTHPASTE   Take these medicines the morning of surgery with A SIP OF WATER: Tylenol, Claritin   These are anesthesia recommendations for holding your anticoagulants.  Please contact your prescribing physician to confirm IF it is safe to hold your anticoagulants for this length of time.   Eliquis Apixaban   72 hours   Xarelto Rivaroxaban   72 hours  Plavix Clopidogrel   120 hours  Pletal Cilostazol   120 hours                  You may not have any metal on your body including jewelry, and body piercing             Do not wear lotions, powders, cologne, or deodorant              Men may shave face  and neck.   Do not bring valuables to the hospital. [place].    Patients discharged on the day of surgery will not be allowed  to drive home.  Someone NEEDS to stay with you for the first 24 hours after anesthesia.   Special Instructions: Bring a copy of your healthcare power of attorney and living will documents         the day of surgery if you haven't scanned them before.              Please read over the following fact sheets you were given: IF YOU HAVE QUESTIONS ABOUT YOUR PRE-OP INSTRUCTIONS PLEASE CALL [facility] - Preparing for Surgery Before surgery, you can play an important role.  Because skin is not sterile, your skin needs to be as free of germs as possible.  You can reduce the number of germs on your skin by washing with CHG (chlorahexidine gluconate) soap before surgery.  CHG is an antiseptic cleaner which kills germs and bonds with the skin to continue killing germs even after washing. Please DO NOT use if you have an allergy to CHG or antibacterial soaps.  If your skin becomes reddened/irritated stop using the CHG and inform your nurse when you arrive at Short Stay. Do not shave (including legs and underarms) for at least 48 hours prior to the first CHG shower.  You may shave your face/neck.  Please follow these instructions carefully:  1.  Shower with CHG Soap the night before surgery and the  morning of surgery.  2.  If you choose to wash your hair, wash your hair first as usual with your normal  shampoo.  3.  After you shampoo, rinse your hair and body thoroughly to remove the shampoo.                             4.  Use CHG as you would any other liquid soap.  You can apply chg directly to the skin and wash.  Gently with a scrungie or clean washcloth.  5.  Apply the CHG Soap to your body ONLY FROM THE NECK DOWN.   Do   not use on face/ open                           Wound or open sores. Avoid  contact with eyes, ears mouth and   genitals (private parts).                       Wash face,  Genitals (private parts) with your normal soap.             6.  Wash thoroughly, paying special attention to the area where your    surgery  will be performed.  7.  Thoroughly rinse your body with warm water from the neck down.  8.  DO NOT shower/wash with your normal soap after using and rinsing off the CHG Soap.                9.  Pat yourself dry with a clean towel.            10.  Wear clean pajamas.            11.  Place clean sheets on your bed the night of your first shower and do not  sleep with pets. Day of Surgery : Do not apply any lotions/deodorants the morning of surgery.  Please wear clean clothes to the hospital/surgery center.  FAILURE TO  FOLLOW THESE INSTRUCTIONS MAY RESULT IN THE CANCELLATION OF YOUR SURGERY  PATIENT SIGNATURE_________________________________  NURSE SIGNATURE__________________________________  ________________________________________________________________________

## 2022-03-15 ENCOUNTER — Encounter (HOSPITAL_COMMUNITY)
Admission: RE | Admit: 2022-03-15 | Discharge: 2022-03-15 | Disposition: A | Discharge status: Home / Self Care | Payer: Federal, State, Local not specified - PPO | Source: Ambulatory Visit | Attending: Urology | Admitting: Urology

## 2022-03-15 ENCOUNTER — Encounter (HOSPITAL_COMMUNITY): Payer: Self-pay

## 2022-03-15 VITALS — BP 143/74 | HR 54 | Temp 98.2°F | Resp 16 | Ht 71.0 in | Wt 200.3 lb

## 2022-03-15 DIAGNOSIS — I4821 Permanent atrial fibrillation: Secondary | ICD-10-CM | POA: Diagnosis not present

## 2022-03-15 DIAGNOSIS — Z01812 Encounter for preprocedural laboratory examination: Secondary | ICD-10-CM | POA: Diagnosis present

## 2022-03-15 DIAGNOSIS — R7303 Prediabetes: Secondary | ICD-10-CM | POA: Diagnosis not present

## 2022-03-15 HISTORY — DX: Prediabetes: R73.03

## 2022-03-15 LAB — BASIC METABOLIC PANEL
Anion gap: 5 (ref 5–15)
BUN: 14 mg/dL (ref 8–23)
CO2: 28 mmol/L (ref 22–32)
Calcium: 9.1 mg/dL (ref 8.9–10.3)
Chloride: 109 mmol/L (ref 98–111)
Creatinine, Ser: 0.57 mg/dL — ABNORMAL LOW (ref 0.61–1.24)
GFR, Estimated: >60 mL/min (ref >60)
Glucose, Bld: 110 mg/dL — ABNORMAL HIGH (ref 70–99)
Potassium: 4.3 mmol/L (ref 3.5–5.1)
Sodium: 142 mmol/L (ref 135–145)

## 2022-03-15 LAB — CBC
HCT: 43.7 % (ref 39.0–52.0)
Hemoglobin: 14.9 g/dL (ref 13.0–17.0)
MCH: 34.9 pg — ABNORMAL HIGH (ref 26.0–34.0)
MCHC: 34.1 g/dL (ref 30.0–36.0)
MCV: 102.3 fL — ABNORMAL HIGH (ref 80.0–100.0)
Platelets: 200 10*3/uL (ref 150–400)
RBC: 4.27 MIL/uL (ref 4.22–5.81)
RDW: 13.9 % (ref 11.5–15.5)
WBC: 4 10*3/uL (ref 4.0–10.5)
nRBC: 0 % (ref 0.0–0.2)

## 2022-03-15 LAB — GLUCOSE, CAPILLARY: Glucose-Capillary: 95 mg/dL (ref 70–99)

## 2022-03-15 LAB — HEMOGLOBIN A1C
Hgb A1c MFr Bld: 5.4 % (ref 4.8–5.6)
Mean Plasma Glucose: 108.28 mg/dL

## 2022-03-16 ENCOUNTER — Ambulatory Visit (INDEPENDENT_AMBULATORY_CARE_PROVIDER_SITE_OTHER): Payer: Federal, State, Local not specified - PPO | Admitting: Physician Assistant

## 2022-03-16 ENCOUNTER — Encounter: Payer: Self-pay | Admitting: Physician Assistant

## 2022-03-16 DIAGNOSIS — Z01810 Encounter for preprocedural cardiovascular examination: Secondary | ICD-10-CM | POA: Diagnosis not present

## 2022-03-16 NOTE — Progress Notes (Addendum)
Virtual Visit via Telephone Note   Because of Tanner CASTELL Jr.'s co-morbid illnesses, he is at least at moderate risk for complications without adequate follow up.  This format is felt to be most appropriate for this patient at this time.  The patient did not have access to video technology/had technical difficulties with video requiring transitioning to audio format only (telephone).  All issues noted in this document were discussed and addressed.  No physical exam could be performed with this format.  Please refer to the patient's chart for his consent to telehealth for [facility].  Evaluation Performed:  Preoperative cardiovascular risk assessment _____________   Date:  03/16/2022   Patient ID:  Tanner Richardson., DOB Apr 01, 1941, MRN [id number] Patient Location:  Home Provider location:   Office  Primary Care Provider:  Shirline Frees, MD Primary Cardiologist:  None  Chief Complaint    81 y.o. y/o male with a h/o permanent atrial fibrillation, dyslipidemia, aortic atherosclerosis, moderate LVH, GERD, prostate CA, arthritis, skin cancer, who is pending Cystoscopy Bladder Biopsy Right Ureteroscopy with Right Urerural Stent Placement, and presents today for telephonic preoperative cardiovascular risk assessment.  Past Medical History    Past Medical History:  Diagnosis Date   Arthritis    hands, "joints"   Atrial fibrillation ([place])    Dysrhythmia    pt. reports that Dr. Kenton Kingfisher (PCP) has done ekg's periodically for once seeing a "blip" on the tracing.    GERD (gastroesophageal reflux disease)    History of skin cancer    Pre-diabetes    Prostate cancer ([place])    also- clear margins on melanoma - removed 2013   Skin cancer    melanoma   Past Surgical History:  Procedure Laterality Date   CARDIOVERSION N/A 05/30/2016   Procedure: CARDIOVERSION;  Surgeon: Jacolyn Reedy, MD;  Location: [facility] ENDOSCOPY;  Service: Cardiovascular;  Laterality: N/A;   COLONOSCOPY  2014    POLYP REMOVED   INGUINAL HERNIA REPAIR Right 08/19/2015   Procedure: RIGHT INGUINAL HERNIA REPAIR;  Surgeon: [name], MD;  Location: [place];  Service: General;  Laterality: Right;   INSERTION OF MESH Right 08/19/2015   Procedure: INSERTION OF MESH;  Surgeon: [name], MD;  Location: [place];  Service: General;  Laterality: Right;   LYMPHADENECTOMY Bilateral 11/09/2013   Procedure: Noel Journey;  Surgeon: [name], MD;  Location: [facility] ORS;  Service: Urology;  Laterality: Bilateral;   MELANOMA EXCISION  2012   CHEST   ROBOT ASSISTED LAPAROSCOPIC RADICAL PROSTATECTOMY N/A 11/09/2013   Procedure: ROBOTIC ASSISTED LAPAROSCOPIC RADICAL PROSTATECTOMY LEVEL 2;  Surgeon: [name], MD;  Location: [facility] OR[facility];  Service: Urology;  Laterality: N/A;   TONSILLECTOMY      Allergies  Allergies  Allergen Reactions   Meloxicam Other (See Comments)    Blisters in the colon   Poultry Meal Nausea And Vomiting    History of Present Illness    Tanner W Tomas Schamp. is a 81 y.o. male who presents via audio/video conferencing for a telehealth visit today. 2D echo 2017 showed EF 55%, moderate LAE, mild MR/TR, trace PR. Pt was last seen in cardiology clinic on 07/31/21 by Dr. Percival Spanish. At that time Tanner Kelemen. was doing well except BP was mildly elevated.  The patient is now pending procedure as outlined above. Since his last visit, he has been doing very well without any new cardiac symptoms. Participates in a half hour exercise routine regularly without angina or dyspnea. Had  BP checked yesterday at 144/73, plans to start monitoring more regularly and will notify Dr. Percival Spanish of the readings.   Home Medications    Prior to Admission medications   Medication Sig Start Date End Date Taking? Authorizing Provider  acetaminophen (TYLENOL) 500 MG tablet Take 1,000 mg by mouth See admin instructions. Take 1000 mg twice daily, may take a third 1000 mg dose at bedtime as needed for pain     [provider]  apixaban (ELIQUIS) 5 MG TABS tablet TAKE 1 TABLET BY MOUTH 2 TIMES DAILY 02/14/22   Minus Breeding, MD  Carboxymethylcellul-Glycerin (LUBRICATING EYE DROPS OP) Place 1 drop into both eyes daily as needed (irritation).    [provider]  Coenzyme Q10 300 MG CAPS Take 300 mg by mouth daily.    [provider]  Cyanocobalamin (B-12) 1000 MCG TBCR Take 1 tablet by mouth daily. 07/22/18   [provider]  diclofenac sodium (VOLTAREN) 1 % GEL Apply 1 application. topically 4 (four) times daily as needed (pain). 07/29/18   [provider]  Echinacea 400 MG CAPS Take 800 mg by mouth 2 (two) times daily.    [provider]  folic acid (FOLVITE) 1 MG tablet Take 1 mg by mouth every evening. 07/22/18   [provider]  Gluc-Chonn-MSM-Boswellia-Vit D (GLUCOSAMINE CHONDROITIN + D3) TABS Take 1-2 tablets by mouth See admin instructions. Take 2 tablets in the morning and 1 tablet in the evening    [provider]  loratadine (CLARITIN) 10 MG tablet Take 10 mg by mouth daily as needed for allergies.    [provider]  Multiple Vitamins-Minerals (MULTIVITAMIN WITH MINERALS) tablet Take 1 tablet by mouth daily.    [provider]  Red Yeast Rice Extract (RED YEAST RICE PO) Take 800 mg by mouth 2 (two) times daily.    [provider]  vitamin C (ASCORBIC ACID) 250 MG tablet Take 250 mg by mouth daily.    [provider]    Physical Exam    Vital Signs:  [name] does not have vital signs available for review today but had it checked yesterday 143/74.  Given telephonic nature of communication, physical exam is limited. AAOx3. NAD. Normal affect.  Speech and respirations are unlabored.  Accessory Clinical Findings    None  Assessment & Plan    1.  Preoperative Cardiovascular Risk Assessment: RCRI 0.4% indicating low CV risk. The patient affirms he has been doing well without  any new cardiac symptoms. They are able to achieve over 4 METS without cardiac limitations. Therefore, based on ACC/AHA guidelines, the patient would be at acceptable risk for the planned procedure without further cardiovascular testing. The patient was advised that if he develops new symptoms prior to surgery to contact our office to arrange for a follow-up visit, and he verbalized understanding.  Per pharmD review, patient can hold Eliquis for 3 days prior to procedure as requested.  A copy of this note will be routed to requesting surgeon.  Time:   Today, I have spent 5 minutes with the patient with telehealth technology discussing medical history, symptoms, and management plan.     Charlie Pitter, PA-C  03/16/2022, 9:03 AM

## 2022-03-21 NOTE — H&P (Signed)
1. Prostate cancer  2. Hematuria  3. Erectile dysfunction   Tanner Richardson returns today 8 years out from his radical prostatectomy for surveillance of his prostate cancer. He states that he has been in his usual state of health until last week when he developed some increasing dysuria and urinary frequency. He has also continued to have intermittent hematuria which was attributed to radiation changes in his bladder when he underwent cystoscopy last summer. However, this has been a little bit increased recently. He remains on Eliquis for atrial fibrillation. He was seen by his primary care provider last week and apparently had a urinalysis that did not raise concern for infection at that time. He has not been on antibiotic therapy for that reason.   With regard to his erectile function, he did undergo a test injection of Trimix last spring. He found this to be effective in obtaining an erection but did have a 20 degree curvature to the left. He also had significant discomfort with his injection likely related to prostaglandin. In addition, his wife had pain with sexual activity raising the question whether she may have dyspareunia. He was able to penetrate despite his curvature.     ALLERGIES: Meloxicam TABS    MEDICATIONS: Sildenafil Citrate 20 mg tablet 1-5 tablet PO PRN as directed  Trimix (20 mcg PGE, 30 mg PAP, 0.5 mg PHE) 5 mL vial (Please provide needles, alcohol swabs, etc.)  Acetaminophen 500 mg tablet 0 Oral  Allerclear  Co Q10  Daily Multiple Vitamins TABS Oral  Echinacea  Echinacea  Eliquis 5 mg tablet  Folic Acid  Glucosamine-Chondroitin Oral Capsule Oral  Vitamin B Complex  Vitamin C TABS Oral  Voltaren Arthritis Pain     GU PSH: Cystoscopy - 06/15/2021, 2019 Laparoscopy; Lymphadenectomy - 2015 Locm 300-'399Mg'$ /Ml Iodine,1Ml - 2019 Penile Injection - 01/27/2021 Robotic Radical Prostatectomy - 2015       PSH Notes: Laparoscopy With Bilateral Total Pelvic Lymphadenectomy,  Prostatect Retropubic Radical W/ Nerve Sparing Laparoscopic, Excision Of Lesion Of Chest Wall   NON-GU PSH: Hernia Repair     GU PMH: Gross hematuria - 06/15/2021, - 10/11/2020, - 2019 ED due to arterial insufficiency - 01/27/2021, - 01/18/2021, - 2018 Prostate Cancer - 01/18/2021, Adenocarcinoma of prostate, - 2016 ED following radical prostatectomy - 2017      PMH Notes:   1) Prostate cancer: He is s/p treatment with a BNS RAL radical prostatectomy and BPLND on 11/09/13. He developed a biochemical recurrence in December 2016 and underwent treatment with 6 months of androgen deprivation and salvage radiation therapy from January through March 2017.   Diagnosis: pT3a N0 Mx, Gleason 4+3=7 adenocarcinoma of the prostate with negative surgical margins  Pretreatment PSA: 3.97  Pretreatment SHIM: 24   2) Hematuria: He developed gross hematuria in February 2019. He is on chronic anticoagulation. 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   Feb 2019: Cystoscopy - changes c/w radiation cystitis, CT imaging - small non-obstructing right renal calculus  Dec 2021: Gross hematuria episode, UA also with bacteruria, culture negative  Aug 2022: Cystoscopy - diffuse radiation changes   NON-GU PMH: Hypercholesterolemia    FAMILY HISTORY: Brain Cancer - Father Colon Cancer - Mother Death In The Family Father - Father Death In The Family Mother - Father   SOCIAL HISTORY: Marital Status: Married Preferred Language: English; Ethnicity: Not Hispanic Or Latino; Race: White Current Smoking Status: Patient does not smoke anymore.  Social Drinker.     REVIEW  OF SYSTEMS:    GU Review Male:   Patient denies frequent urination, hard to postpone urination, burning/ pain with urination, get up at night to urinate, leakage of urine, stream starts and stops, trouble starting your streams, and have to strain to urinate .  Gastrointestinal (Upper):   Patient denies nausea and vomiting.   Gastrointestinal (Lower):   Patient denies diarrhea and constipation.  Constitutional:   Patient denies fever, night sweats, weight loss, and fatigue.  Skin:   Patient denies skin rash/ lesion and itching.  Eyes:   Patient denies double vision and blurred vision.  Ears/ Nose/ Throat:   Patient denies sore throat and sinus problems.  Hematologic/Lymphatic:   Patient denies swollen glands and easy bruising.  Cardiovascular:   Patient denies leg swelling and chest pains.  Respiratory:   Patient denies cough and shortness of breath.  Endocrine:   Patient denies excessive thirst.  Musculoskeletal:   Patient denies back pain and joint pain.  Neurological:   Patient denies headaches and dizziness.  Psychologic:   Patient denies depression and anxiety.   VITAL SIGNS:      01/19/2022 09:14 AM  Weight 198 lb / 89.81 kg  Height 71 in / 180.34 cm  BP 136/69 mmHg  Pulse 69 /min  Temperature 98.4 F / 36.8 C  BMI 27.6 kg/m   MULTI-SYSTEM PHYSICAL EXAMINATION:    Constitutional: Well-nourished. No physical deformities. Normally developed. Good grooming.  CV: RRR Lungs: Clear    Complexity of Data:  Lab Test Review:   PSA  Records Review:   Previous Patient Records   01/12/22 01/11/21 01/19/20 01/14/19 07/08/18 12/11/17 05/21/17 05/06/17  PSA  Total PSA <0.015 ng/mL <0.015 ng/mL <0.015 ng/mL <0.015 ng/mL <0.015 ng/mL <0.015 ng/mL <0.015 ng/dL < 0.01 ng/dl    12/18/17 05/21/17  Hormones  Testosterone, Total 357.3 ng/dL 321.6 ng/dL     ASSESSMENT:      ICD-10 Details  1 GU:   Prostate Cancer - C61   3   ED due to arterial insufficiency - N52.01   2 NON-GU:   Bacteriuria - R82.71    PLAN:       1. Prostate cancer: His PSA remains undetectable. Continue annual surveillance.   2. UTI: He does appear to have a urinary tract infection today and will be treated with cefdinir. His urine has been cultured. He will have a drop off urinalysis in a few weeks to ensure resolution. He will  notify me if his symptoms persist. He understands that this also may be exacerbating his hematuria lately.   3. Gross hematuria: This may be multifactorial related to his anticoagulation, infection, and history of radiation therapy. However, we will repeat his upper tract imaging since it has been over 3 years to rule out other possibilities. Hematuria protocol CT scan will be ordered and his renal function will be checked today.   4. Erectile dysfunction/Peyronie's disease: We discussed the complexity of his situation both related to his treatment, curvature, and his wife's discomfort with sexual activity. He understands that his wife's dyspareunia would likely be unrelated to his penile curvature. At this time, he wishes to discuss all options regarding treatment for his erectile dysfunction with his wife before he elects to proceed understanding that the rate limiting factor could be her dyspareunia. If he did wish to proceed, we would consider adjusting his Trimix dose and reducing the prostaglandin concentration.      APPENDED NOTES:    I spoke with Tanner Richardson  about his CT scan findings that suggest a possible bladder vs distal right ureteral mass that require biopsy and endoscopic evaluation. Will proceed with cystoscopy, bladder biopsy/TUR/right ureteroscopy and possible right ureteral stent. He agrees with this plan.

## 2022-03-22 ENCOUNTER — Ambulatory Visit (HOSPITAL_COMMUNITY): Payer: Federal, State, Local not specified - PPO | Admitting: Physician Assistant

## 2022-03-22 ENCOUNTER — Ambulatory Visit (HOSPITAL_COMMUNITY): Payer: Federal, State, Local not specified - PPO | Admitting: Anesthesiology

## 2022-03-22 ENCOUNTER — Encounter (HOSPITAL_COMMUNITY)
Admission: RE | Disposition: A | Discharge status: Home / Self Care | Payer: Self-pay | Source: Ambulatory Visit | Attending: Urology

## 2022-03-22 ENCOUNTER — Ambulatory Visit (HOSPITAL_COMMUNITY)
Admission: RE | Admit: 2022-03-22 | Discharge: 2022-03-22 | Disposition: A | Discharge status: Home / Self Care | Payer: Federal, State, Local not specified - PPO | Source: Ambulatory Visit | Attending: Urology | Admitting: Urology

## 2022-03-22 ENCOUNTER — Ambulatory Visit (HOSPITAL_COMMUNITY): Payer: Federal, State, Local not specified - PPO

## 2022-03-22 ENCOUNTER — Encounter (HOSPITAL_COMMUNITY): Payer: Self-pay | Admitting: Urology

## 2022-03-22 DIAGNOSIS — Z8546 Personal history of malignant neoplasm of prostate: Secondary | ICD-10-CM | POA: Insufficient documentation

## 2022-03-22 DIAGNOSIS — M1990 Unspecified osteoarthritis, unspecified site: Secondary | ICD-10-CM | POA: Insufficient documentation

## 2022-03-22 DIAGNOSIS — N3289 Other specified disorders of bladder: Secondary | ICD-10-CM | POA: Diagnosis not present

## 2022-03-22 DIAGNOSIS — R7303 Prediabetes: Secondary | ICD-10-CM | POA: Diagnosis not present

## 2022-03-22 DIAGNOSIS — I4821 Permanent atrial fibrillation: Secondary | ICD-10-CM

## 2022-03-22 DIAGNOSIS — Z7901 Long term (current) use of anticoagulants: Secondary | ICD-10-CM | POA: Insufficient documentation

## 2022-03-22 DIAGNOSIS — K219 Gastro-esophageal reflux disease without esophagitis: Secondary | ICD-10-CM | POA: Diagnosis not present

## 2022-03-22 DIAGNOSIS — I4891 Unspecified atrial fibrillation: Secondary | ICD-10-CM | POA: Diagnosis not present

## 2022-03-22 DIAGNOSIS — Z87891 Personal history of nicotine dependence: Secondary | ICD-10-CM | POA: Insufficient documentation

## 2022-03-22 HISTORY — PX: CYSTOSCOPY WITH BIOPSY: SHX5122

## 2022-03-22 HISTORY — PX: CYSTOSCOPY W/ URETERAL STENT PLACEMENT: SHX1429

## 2022-03-22 LAB — APTT: aPTT: 29 seconds (ref 24–36)

## 2022-03-22 LAB — GLUCOSE, CAPILLARY: Glucose-Capillary: 111 mg/dL — ABNORMAL HIGH (ref 70–99)

## 2022-03-22 LAB — PROTIME-INR
INR: 1.1 (ref 0.8–1.2)
Prothrombin Time: 13.6 seconds (ref 11.4–15.2)

## 2022-03-22 SURGERY — CYSTOSCOPY, WITH BIOPSY
Anesthesia: General | Site: Urethra | Laterality: Right

## 2022-03-22 MED ORDER — PROPOFOL 10 MG/ML IV BOLUS
INTRAVENOUS | Status: AC
  Filled 2022-03-22: qty 20

## 2022-03-22 MED ORDER — LIDOCAINE HCL (PF) 2 % IJ SOLN
INTRAMUSCULAR | Status: AC
  Filled 2022-03-22: qty 5

## 2022-03-22 MED ORDER — ACETAMINOPHEN 500 MG PO TABS
1000.0000 mg | ORAL_TABLET | Freq: Once | ORAL | Status: AC
  Administered 2022-03-22: 1000 mg via ORAL
  Filled 2022-03-22: qty 2

## 2022-03-22 MED ORDER — CEFAZOLIN SODIUM-DEXTROSE 2-4 GM/100ML-% IV SOLN
2.0000 g | Freq: Once | INTRAVENOUS | Status: AC
  Administered 2022-03-22: 2 g via INTRAVENOUS
  Filled 2022-03-22: qty 100

## 2022-03-22 MED ORDER — PROPOFOL 10 MG/ML IV BOLUS
INTRAVENOUS | Status: DC | PRN
  Administered 2022-03-22: 140 mg via INTRAVENOUS

## 2022-03-22 MED ORDER — IOHEXOL 300 MG/ML  SOLN
INTRAMUSCULAR | Status: DC | PRN
  Administered 2022-03-22: 16 mL via URETHRAL

## 2022-03-22 MED ORDER — ONDANSETRON HCL 4 MG/2ML IJ SOLN
INTRAMUSCULAR | Status: AC
  Filled 2022-03-22: qty 2

## 2022-03-22 MED ORDER — LIDOCAINE HCL (CARDIAC) PF 100 MG/5ML IV SOSY
PREFILLED_SYRINGE | INTRAVENOUS | Status: DC | PRN
  Administered 2022-03-22: 60 mg via INTRAVENOUS

## 2022-03-22 MED ORDER — LACTATED RINGERS IV SOLN: INTRAVENOUS | Status: DC

## 2022-03-22 MED ORDER — OXYCODONE HCL 5 MG/5ML PO SOLN: 5.0000 mg | Freq: Once | ORAL | Status: DC | PRN

## 2022-03-22 MED ORDER — PHENAZOPYRIDINE HCL 200 MG PO TABS: 200.0000 mg | ORAL_TABLET | Freq: Three times a day (TID) | ORAL | 0 refills | Status: DC | PRN

## 2022-03-22 MED ORDER — ONDANSETRON HCL 4 MG/2ML IJ SOLN
INTRAMUSCULAR | Status: DC | PRN
  Administered 2022-03-22: 4 mg via INTRAVENOUS

## 2022-03-22 MED ORDER — CHLORHEXIDINE GLUCONATE 0.12 % MT SOLN
15.0000 mL | Freq: Once | OROMUCOSAL | Status: AC
  Administered 2022-03-22: 15 mL via OROMUCOSAL

## 2022-03-22 MED ORDER — ORAL CARE MOUTH RINSE: 15.0000 mL | Freq: Once | OROMUCOSAL | Status: AC

## 2022-03-22 MED ORDER — FENTANYL CITRATE (PF) 100 MCG/2ML IJ SOLN
INTRAMUSCULAR | Status: DC | PRN
  Administered 2022-03-22: 50 ug via INTRAVENOUS

## 2022-03-22 MED ORDER — FENTANYL CITRATE PF 50 MCG/ML IJ SOSY: 25.0000 ug | PREFILLED_SYRINGE | INTRAMUSCULAR | Status: DC | PRN

## 2022-03-22 MED ORDER — ONDANSETRON HCL 4 MG/2ML IJ SOLN: 4.0000 mg | Freq: Once | INTRAMUSCULAR | Status: DC | PRN

## 2022-03-22 MED ORDER — FENTANYL CITRATE (PF) 100 MCG/2ML IJ SOLN
INTRAMUSCULAR | Status: AC
  Filled 2022-03-22: qty 2

## 2022-03-22 MED ORDER — OXYCODONE HCL 5 MG PO TABS: 5.0000 mg | ORAL_TABLET | Freq: Once | ORAL | Status: DC | PRN

## 2022-03-22 SURGICAL SUPPLY — 22 items
BAG URINE DRAIN 2000ML AR STRL (UROLOGICAL SUPPLIES) IMPLANT
BAG URO CATCHER STRL LF (MISCELLANEOUS) ×3 IMPLANT
CATH URETL OPEN END 6FR 70 (CATHETERS) ×1 IMPLANT
CLOTH BEACON ORANGE TIMEOUT ST (SAFETY) ×2 IMPLANT
DRAPE FOOT SWITCH (DRAPES) ×3 IMPLANT
ELECT REM PT RETURN 15FT ADLT (MISCELLANEOUS) ×3 IMPLANT
GLOVE BIOGEL M STRL SZ7.5 (GLOVE) ×2 IMPLANT
GLOVE SURG LX 7.5 STRW (GLOVE) ×6
GLOVE SURG LX STRL 7.5 STRW (GLOVE) ×2 IMPLANT
GOWN STRL REUS W/ TWL XL LVL3 (GOWN DISPOSABLE) ×2 IMPLANT
GOWN STRL REUS W/TWL XL LVL3 (GOWN DISPOSABLE) ×3
GUIDEWIRE STR DUAL SENSOR (WIRE) ×4 IMPLANT
GUIDEWIRE ZIPWRE .038 STRAIGHT (WIRE) IMPLANT
KIT TURNOVER KIT A (KITS) ×1 IMPLANT
LOOP CUT BIPOLAR 24F LRG (ELECTROSURGICAL) IMPLANT
MANIFOLD NEPTUNE II (INSTRUMENTS) ×3 IMPLANT
PACK CYSTO (CUSTOM PROCEDURE TRAY) ×3 IMPLANT
PENCIL SMOKE EVACUATOR (MISCELLANEOUS) IMPLANT
SYR TOOMEY IRRIG 70ML (MISCELLANEOUS)
SYRINGE TOOMEY IRRIG 70ML (MISCELLANEOUS) IMPLANT
TUBING CONNECTING 10 (TUBING) ×3 IMPLANT
TUBING UROLOGY SET (TUBING) ×3 IMPLANT

## 2022-03-22 NOTE — Transfer of Care (Signed)
Immediate Anesthesia Transfer of Care Note  Patient: Tanner Richardson.  Procedure(s) Performed: CYSTOSCOPY WITH BLADDER  BIOPSY (Urethra) CYSTOSCOPY WITH RETROGRADE PYELOGRAM/ URETEROSCOPY (Right: Urethra)  Patient Location: PACU  Anesthesia Type:General  Level of Consciousness: awake, alert  and oriented  Airway & Oxygen Therapy: Patient Spontanous Breathing and Patient connected to face mask oxygen  Post-op Assessment: Report given to RN and Post -op Vital signs reviewed and stable  Post vital signs: Reviewed and stable  Last Vitals:  Vitals Value Taken Time  BP 145/82 03/22/22 1213  Temp    Pulse 62 03/22/22 1214  Resp 12 03/22/22 1214  SpO2 100 % 03/22/22 1214  Vitals shown include unvalidated device data.  Last Pain:  Vitals:   03/22/22 1022  TempSrc:   PainSc: 0-No pain         Complications: No notable events documented.

## 2022-03-22 NOTE — Anesthesia Procedure Notes (Signed)
Procedure Name: LMA Insertion Date/Time: 03/22/2022 11:31 AM Performed by: [place] Huxton Glaus C, CRNA Pre-anesthesia Checklist: Patient identified, Emergency Drugs available, Suction available and Patient being monitored Patient Re-evaluated:Patient Re-evaluated prior to induction Oxygen Delivery Method: Circle system utilized Preoxygenation: Pre-oxygenation with 100% oxygen Induction Type: IV induction Ventilation: Mask ventilation without difficulty LMA: LMA inserted LMA Size: 5.0 Number of attempts: 1 Airway Equipment and Method: Bite block Placement Confirmation: positive ETCO2 Tube secured with: Tape Dental Injury: Teeth and Oropharynx as per pre-operative assessment

## 2022-03-22 NOTE — Anesthesia Preprocedure Evaluation (Addendum)
Anesthesia Evaluation  Patient identified by MRN, date of birth, ID band Patient awake    Reviewed: Allergy & Precautions, NPO status , Patient's Chart, lab work & pertinent test results  History of Anesthesia Complications Negative for: history of anesthetic complications  Airway Mallampati: III  TM Distance: >3 FB Neck ROM: Full    Dental  (+) Dental Advisory Given, Teeth Intact   Pulmonary former smoker,    Pulmonary exam normal        Cardiovascular + dysrhythmias Atrial Fibrillation  Rhythm:Irregular Rate:Bradycardia     Neuro/Psych negative neurological ROS  negative psych ROS   GI/Hepatic Neg liver ROS, GERD  Controlled,  Endo/Other   Pre-DM   Renal/GU negative Renal ROS    Prostate cancer     Musculoskeletal  (+) Arthritis ,   Abdominal   Peds  Hematology  On eliquis    Anesthesia Other Findings   Reproductive/Obstetrics                            Anesthesia Physical Anesthesia Plan  ASA: 3  Anesthesia Plan: General   Post-op Pain Management: Tylenol PO (pre-op)*   Induction: Intravenous  PONV Risk Score and Plan: 2 and Treatment may vary due to age or medical condition and Ondansetron  Airway Management Planned: LMA  Additional Equipment: None  Intra-op Plan:   Post-operative Plan: Extubation in OR  Informed Consent: I have reviewed the patients History and Physical, chart, labs and discussed the procedure including the risks, benefits and alternatives for the proposed anesthesia with the patient or authorized representative who has indicated his/her understanding and acceptance.     Dental advisory given  Plan Discussed with: CRNA and Anesthesiologist  Anesthesia Plan Comments:        Anesthesia Quick Evaluation

## 2022-03-22 NOTE — Anesthesia Postprocedure Evaluation (Signed)
Anesthesia Post Note  Patient: Tanner Richardson.  Procedure(s) Performed: CYSTOSCOPY WITH BLADDER  BIOPSY (Urethra) CYSTOSCOPY WITH RETROGRADE PYELOGRAM/ URETEROSCOPY (Right: Urethra)     Patient location during evaluation: PACU Anesthesia Type: General Level of consciousness: awake and alert Pain management: pain level controlled Vital Signs Assessment: post-procedure vital signs reviewed and stable Respiratory status: spontaneous breathing, nonlabored ventilation and respiratory function stable Cardiovascular status: stable, blood pressure returned to baseline and bradycardic Anesthetic complications: no   No notable events documented.  Last Vitals:  Vitals:   03/22/22 1230 03/22/22 1245  BP: (!) 142/75 (!) 161/82  Pulse: (!) 58 (!) 55  Resp: 14 20  Temp:  36.5 C  SpO2: 98% 98%    Last Pain:  Vitals:   03/22/22 1245  TempSrc:   PainSc: 0-No pain                 Audry Pili

## 2022-03-22 NOTE — Discharge Instructions (Signed)
You may see some blood in the urine and may have some burning with urination for 48-72 hours. You also may notice that you have to urinate more frequently or urgently after your procedure which is normal.  You should call should you develop an inability urinate, fever > 101, persistent nausea and vomiting that prevents you from eating or drinking to stay hydrated.  You may resume Eliquis in 72 hrs if no blood in the urine is noted. You may resume your other supplements/vitamins in 7 days.

## 2022-03-22 NOTE — Op Note (Signed)
Preoperative diagnosis: Right bladder wall thickening and thickening of the distal right ureter  Postoperative diagnosis: Right bladder wall thickening and thickening of the distal right ureter  Procedures: 1.  Cystoscopy 2.  Bladder washing for cytology 3.  Right retrograde pyelography with interpretation 4.  Right ureteroscopy 5.  Bladder biopsies with fulguration  Surgeon: Pryor Curia MD  Anesthesia: General  Complications: None  EBL: Minimal  Specimens: Right lateral bladder biopsies and bladder cytology  Disposition of specimens: Pathology  Intraoperative findings: Cystoscopy revealed findings of diffuse radiation changes within the bladder.  Right retrograde pyelography was performed the 6 French ureteral catheter and Omnipaque contrast.  This did reveal some dilation of the proximal and mid ureter without significant hydronephrosis or calyceal dilation.  There was some slight narrowing of the distal right ureter but no filling defects were noted.  Description of procedure: The patient was taken to the operating room and a general anesthetic was administered.  He was given preoperative antibiotics, placed in the dorsolithotomy position, and prepped and draped in usual sterile fashion.  Next, a preoperative timeout was performed.  Cystourethroscopy was performed with a 22 French cystoscope.  This revealed a normal anterior and posterior urethra.  Inspection of the bladder neck and the bladder was performed systematically.  There were diffuse erythematous changes consistent with radiation changes.  No obvious bladder tumors were identified.  A bladder washing was obtained for cytology.  Particularly, the right lateral bladder was carefully examined considering preoperative imaging findings that suggested thickening on the side of the bladder.  However, no significant abnormalities or even asymmetric findings compared to the left side of the bladder were noted.  Multiple  bladder biopsies were taken as a precaution from the right lateral bladder wall in the vicinity of the abnormal imaging findings.  These areas were then fulgurated with a Bugbee electrode.  A right retrograde pyelogram was performed with a 6 French ureteral catheter and Omnipaque contrast.  Findings are as dictated above.  As a precaution and considering the preoperative imaging that suggested distal right ureteral thickening, a 0.38 guidewire was placed within the right ureter and a semirigid ureteroscope was used to visualize the mid and distal ureter.  No abnormalities were noted.  The bladder was emptied and reinspected.  Hemostasis of the bladder biopsy sites appeared excellent.  The patient was able to tolerate the procedure well without complications.  He was able to be awakened and transferred to the recovery unit in satisfactory condition.

## 2022-03-23 ENCOUNTER — Encounter (HOSPITAL_COMMUNITY): Payer: Self-pay | Admitting: Urology

## 2022-03-23 ENCOUNTER — Telehealth: Payer: Self-pay | Admitting: Cardiology

## 2022-03-23 LAB — SURGICAL PATHOLOGY

## 2022-03-23 NOTE — Telephone Encounter (Signed)
Spoke to the patient and he said there was a miscommunication. The spinal injection is on June 5 with Dr. Alfonso Ramus at [facility]. Noted he just has a bladder biopsy and is currently holding Eliquis from 5/21 to 5/29. He is requesting guidance about up coming spinal injection and eliquis.  Advise will forward to preop team.

## 2022-03-23 NOTE — Telephone Encounter (Signed)
Preoperative team please contact patient/requesting office and have them submit a formal preoperative cardiac request form.  Thank you for your help.  Jossie Ng. Chauntay Paszkiewicz NP-C    03/23/2022, 11:52 AM [place] [address] Office [phone] Fax [phone]

## 2022-03-23 NOTE — Telephone Encounter (Signed)
Pt c/o medication issue:  1. Name of Medication: Eliquis   2. How are you currently taking this medication (dosage and times per day)? 5 mg 2x daily   3. Are you having a reaction (difficulty breathing--STAT)? No  4. What is your medication issue? Pt was recently given spinal injection and is concerned after reading more on Eliquis. He says that he read that having the injection with Eliquis can cause blood clots and cause paralysis and would like further information.

## 2022-03-23 NOTE — Telephone Encounter (Signed)
Called [facility] and s/w Sherri, one of the surgery schedulers. I informed Sherri that the pt contacted our office and said he was having an spine injection. Sherri, placed me on a brief hold and came back to the phone stating that Dr. Alfonso Ramus does not do spine injections and not sure who scheduled this, nor did they have a clearance request for the procedure. Sherri, said she is going to look into this and that if they are doing the injection she will fax over a clearance request. I will update the pre op provider and the pharm-d as well.

## 2022-03-23 NOTE — Telephone Encounter (Signed)
Pt should let us know whenever he has procedures scheduled so that we can provide input on him safely holding his Eliquis around procedure time. This would include any spinal injections. I do not see that we were made aware of any recent spinal injections.

## 2022-03-25 LAB — CYTOLOGY - NON PAP

## 2022-06-04 ENCOUNTER — Other Ambulatory Visit: Payer: Self-pay | Admitting: Cardiology

## 2022-06-04 ENCOUNTER — Other Ambulatory Visit: Payer: Self-pay | Admitting: Physical Medicine and Rehabilitation

## 2022-06-04 DIAGNOSIS — M545 Low back pain, unspecified: Secondary | ICD-10-CM

## 2022-06-05 ENCOUNTER — Telehealth: Payer: Self-pay | Admitting: Nurse Practitioner

## 2022-06-05 NOTE — Telephone Encounter (Signed)
Preop callback team, could you please contact patient to find out where and with whom patient is having procedure so that we can get a formal request.   Thank you Emmaline Life, NP-C    06/05/2022, 11:19 AM [place] [address] Office [phone] Fax [phone]

## 2022-06-05 NOTE — Telephone Encounter (Signed)
I left a message for the patient to return my call about surgical clearance.

## 2022-06-05 NOTE — Telephone Encounter (Signed)
Patient returned call, transferred to [place].

## 2022-06-05 NOTE — Telephone Encounter (Signed)
-----   Message from Minus Breeding, MD sent at 06/05/2022 11:16 AM EDT ----- It would be OK to stop Eliquis for three days prior to procedure.  I am routing this to our Preop Pool so that we can document in our formal process.  I don't know if there has been a request submitted through that process.   ----- Message ----- From: Cristopher Peru Sent: 06/04/2022  10:03 AM EDT To: Minus Breeding, MD  Patient has been refer to our office for Lumbar Epidural and need your clarence ok for pt to stop Eliquis x 2 days.  Thank You, Vivien Presto

## 2022-06-05 NOTE — Telephone Encounter (Signed)
Spoke with patient who states that [place] is doing his procedure and he gave the phone # [phone] to speak to [place]. I made him aware that I will call her to get more information. Pt thanked me for the call.  I saw with Tye Maryland from [facility] is going to fax over a formal request to be documented.

## 2022-06-06 ENCOUNTER — Other Ambulatory Visit: Payer: Self-pay | Admitting: Family Medicine

## 2022-06-06 DIAGNOSIS — R918 Other nonspecific abnormal finding of lung field: Secondary | ICD-10-CM

## 2022-06-07 NOTE — Telephone Encounter (Signed)
Will fax notes to requesting office with the recommendations in regard to blood thinner Eliquis to hold x 3  and not 2 days. Please see notes from Melina Copa, [facility]  Fax # [phone]

## 2022-06-07 NOTE — Telephone Encounter (Addendum)
Covering preop today. Dr. Rosezella Florida message (copied below by Elwyn Reach) was parked in staff messages. Do not yet see formal clearance to act on. Will route back to callback staff to ensure this is in their queue to engage and enter when available. Will remove this message from preop box until formal clearance has been routed.

## 2022-06-07 NOTE — Telephone Encounter (Signed)
Will route to pharm then pt will need virtual visit call. Please let Tanner Richardson know that the duration of holding may be incorrect so do not yet proceed with procedure until we finalize the recommendation. Please let her know to send her staff message requests to the callback pool from now on to enter as a formal request instead of directly to MD so that we can help streamline her process.

## 2022-06-07 NOTE — Telephone Encounter (Signed)
Dr Hochrein's response was for pt to hold Eliquis for 3 days not 2 days before procedure. However, pt does not have Eliquis or any other anticoagulants on his medication list. Need to clarify if he is taking Eliquis at all, it was removed from his med list on 03/22/22 with instructions to stop taking at discharge, not sure if this was a medication error made at discharge.

## 2022-06-07 NOTE — Telephone Encounter (Addendum)
I called and s/w Tye Maryland today with [facility] [phone]. Tye Maryland states to me that Dr. Eula Fried already faxed her the clearance ok to hold Eliquis x 2 days. I did state that there is a protocol that we follow for the cardiologist, which involves having an official clearance request in the system. Cathy verbally stated procedure.     Pre-operative Risk Assessment    Patient Name: Tanner Richardson.  DOB: 1941-02-08 MRN: [id number]      Request for Surgical Clearance    Procedure:   LUMBAR EPIDURAL   Date of Surgery:  Clearance TBD                                 Surgeon:  NOT LISTED Surgeon's Group or Practice Name:  [facility] Phone number:  [phone] Fax number:     Type of Clearance Requested:   - Medical  - Pharmacy:  Hold Apixaban (Eliquis) x 2 DAYS PRIOR TO INJECTION; SEE DR. HOCHREIN OK TO HOLD BLOOD THINNER; AS WELL AS CATHY STATED DR. Marathon FAXED OVER TO THE OK TO HOLD ELIQUIS x 2 DAYS PRIOR   Type of Anesthesia:  NONE PER CATHY   Additional requests/questions:    Jiles Prows   06/07/2022, 9:02 AM

## 2022-06-07 NOTE — Telephone Encounter (Signed)
I will fax over as FYI to requesting office the pt needs a pre op telephone visit before being cleared. See notes from Melina Copa, [facility].

## 2022-06-07 NOTE — Telephone Encounter (Addendum)
   Patient Name: Tanner Richardson.  DOB: 09-19-1941 MRN: [id number]  Primary Cardiologist: Dr. Minus Breeding  Chart reviewed as part of pre-operative protocol coverage. I spoke with patient via regular phone call since I had just performed an unrelated clearance on him 02/2022. The patient affirms he has been doing well without any new cardiac symptoms, cardioversion or stroke symptoms. Therefore, based on ACC/AHA guidelines, the patient would be at acceptable risk for the planned procedure without further cardiovascular testing. The patient was advised that if he develops new symptoms prior to surgery to contact our office to arrange for a follow-up visit, and he verbalized understanding.  We want to clarify he is OK to hold Eliquis for 3 days prior to procedure, not 2. This was relayed in Dr. Rosezella Florida message as well. Patient confirms he is on this and I added it back to his medicine list. I relayed these instructions to the patient who verbalized understanding. I do not have a fax number to route this back to so I will message callback to call to let them know. Would suggest future clearances be routed to the preop pool given our standard process. Thank you!  Charlie Pitter, PA-C 06/07/2022, 3:43 PM

## 2022-06-07 NOTE — Telephone Encounter (Signed)
Left message for pt to call back for tele appt

## 2022-06-11 ENCOUNTER — Ambulatory Visit
Admission: RE | Admit: 2022-06-11 | Discharge: 2022-06-11 | Disposition: A | Discharge status: Home / Self Care | Payer: Federal, State, Local not specified - PPO | Source: Ambulatory Visit | Attending: Physical Medicine and Rehabilitation | Admitting: Physical Medicine and Rehabilitation

## 2022-06-11 DIAGNOSIS — M545 Low back pain, unspecified: Secondary | ICD-10-CM

## 2022-06-11 MED ORDER — METHYLPREDNISOLONE ACETATE 40 MG/ML INJ SUSP (RADIOLOG
80.0000 mg | Freq: Once | INTRAMUSCULAR | Status: AC
  Administered 2022-06-11: 80 mg via EPIDURAL

## 2022-06-11 MED ORDER — IOPAMIDOL (ISOVUE-M 200) INJECTION 41%
1.0000 mL | Freq: Once | INTRAMUSCULAR | Status: AC
  Administered 2022-06-11: 1 mL via EPIDURAL

## 2022-06-11 NOTE — Discharge Instructions (Signed)

## 2022-06-14 ENCOUNTER — Other Ambulatory Visit: Payer: Self-pay | Admitting: Cardiology

## 2022-06-14 NOTE — Telephone Encounter (Signed)
Eliquis '5mg'$  refill request received. Patient is 81 years old, weight-90.9kg, Crea-0.57 on 03/15/2022, Diagnosis-Afib, and last seen by Melina Copa on 03/16/2022. Dose is appropriate based on dosing criteria. Will send in refill to requested pharmacy.

## 2022-07-18 ENCOUNTER — Ambulatory Visit
Admission: RE | Admit: 2022-07-18 | Discharge: 2022-07-18 | Disposition: A | Discharge status: Home / Self Care | Payer: Federal, State, Local not specified - PPO | Source: Ambulatory Visit | Attending: Family Medicine | Admitting: Family Medicine

## 2022-07-18 DIAGNOSIS — R918 Other nonspecific abnormal finding of lung field: Secondary | ICD-10-CM

## 2022-08-02 NOTE — Progress Notes (Signed)
Cardiology Office Note   Date:  08/03/2022   ID:  Tanner Exon., DOB 01/10/1941, MRN [id number]  PCP:  Tanner Frees, MD  Cardiologist:   None   Chief Complaint  Patient presents with   Atrial Fibrillation     History of Present Illness: Tanner Kohen. is a 81 y.o. male who presents for evaluation of atrial fib.  He was seen by Dr. Wynonia Lawman.   He had DCCV.  Since I saw him he had cystoscopy, bladder biopsy, right ureteroscopy.  He had some hematuria.  This however seems to have cleared up.  He is done quite well otherwise.  He is in atrial fibrillation but he really does not notice this.  He still rides horses.  The patient denies any new symptoms such as chest discomfort, neck or arm discomfort. There has been no new shortness of breath, PND or orthopnea. There have been no reported palpitations, presyncope or syncope.   Past Medical History:  Diagnosis Date   Arthritis    hands, "joints"   Atrial fibrillation ([place])    Dysrhythmia    pt. reports that Dr. Kenton Kingfisher (PCP) has done ekg's periodically for once seeing a "blip" on the tracing.    GERD (gastroesophageal reflux disease)    History of skin cancer    Pre-diabetes    Prostate cancer ([place])    also- clear margins on melanoma - removed 2013   Skin cancer    melanoma    Past Surgical History:  Procedure Laterality Date   CARDIOVERSION N/A 05/30/2016   Procedure: CARDIOVERSION;  Surgeon: Jacolyn Reedy, MD;  Location: [facility] [place];  Service: Cardiovascular;  Laterality: N/A;   COLONOSCOPY  2014   POLYP REMOVED   CYSTOSCOPY W/ URETERAL STENT PLACEMENT Right 03/22/2022   Procedure: CYSTOSCOPY WITH RETROGRADE PYELOGRAM/ URETEROSCOPY;  Surgeon: [name], MD;  Location: [facility];  Service: Urology;  Laterality: Right;   CYSTOSCOPY WITH BIOPSY N/A 03/22/2022   Procedure: CYSTOSCOPY WITH BLADDER  BIOPSY;  Surgeon: [name], MD;  Location: [facility];  Service: Urology;  Laterality: N/A;  ONLY NEEDS 60 MIN    INGUINAL HERNIA REPAIR Right 08/19/2015   Procedure: RIGHT INGUINAL HERNIA REPAIR;  Surgeon: [name], MD;  Location: [place];  Service: General;  Laterality: Right;   INSERTION OF MESH Right 08/19/2015   Procedure: INSERTION OF MESH;  Surgeon: [name], MD;  Location: [place];  Service: General;  Laterality: Right;   LYMPHADENECTOMY Bilateral 11/09/2013   Procedure: Noel Journey;  Surgeon: [name], MD;  Location: [facility];  Service: Urology;  Laterality: Bilateral;   MELANOMA EXCISION  2012   CHEST   ROBOT ASSISTED LAPAROSCOPIC RADICAL PROSTATECTOMY N/A 11/09/2013   Procedure: ROBOTIC ASSISTED LAPAROSCOPIC RADICAL PROSTATECTOMY LEVEL 2;  Surgeon: [name], MD;  Location: [facility];  Service: Urology;  Laterality: N/A;   TONSILLECTOMY       Current Outpatient Medications  Medication Sig Dispense Refill   acetaminophen (TYLENOL) 500 MG tablet Take 1,000 mg by mouth See admin instructions. Take 1000 mg twice daily, may take a third 1000 mg dose at bedtime as needed for pain     apixaban (ELIQUIS) 5 MG TABS tablet TAKE 1 TABLET BY MOUTH 2 TIMES DAILY 180 tablet 1   ascorbic acid (VITAMIN C) 250 MG tablet Take 250 mg by mouth daily.     Carboxymethylcellul-Glycerin (LUBRICATING EYE DROPS OP) Place 1 drop into both eyes daily as needed (irritation).  Coenzyme Q10 (COQ10 PO) Take 300 mg by mouth daily.     cyanocobalamin 1000 MCG tablet Take 1,000 mcg by mouth daily.     diclofenac sodium (VOLTAREN) 1 % GEL Apply 1 application. topically 4 (four) times daily as needed (pain).  5   Echinacea 400 MG CAPS Take by mouth.     folic acid (FOLVITE) 1 MG tablet Take 1 mg by mouth daily.     Gluc-Chonn-MSM-Boswellia-Vit D (GLUCOSAMINE CHONDROITIN + D3 PO) Take by mouth.     loratadine (CLARITIN) 10 MG tablet Take 10 mg by mouth daily as needed for allergies.     NON FORMULARY as needed. Hemp cream for back pain     Red Yeast Rice Extract (RED YEAST RICE PO) Take by mouth.     No  current facility-administered medications for this visit.    Allergies:   Meloxicam and Poultry meal    ROS:  Please see the history of present illness.   Otherwise, review of systems are positive for none.   All other systems are reviewed and negative.    PHYSICAL EXAM: VS:  BP 136/74   Pulse (!) 59   Ht '5\' 11"'$  (1.803 m)   Wt 194 lb 12.8 oz (88.4 kg)   SpO2 97%   BMI 27.17 kg/m  , BMI Body mass index is 27.17 kg/m. GENERAL:  Well appearing NECK:  No jugular venous distention, waveform within normal limits, carotid upstroke brisk and symmetric, no bruits, no thyromegaly LUNGS:  Clear to auscultation bilaterally CHEST:  Unremarkable HEART:  PMI not displaced or sustained,S1 and S2 within normal limits, no S3, no clicks, no rubs, no murmurs, irregular ABD:  Flat, positive bowel sounds normal in frequency in pitch, no bruits, no rebound, no guarding, no midline pulsatile mass, no hepatomegaly, no splenomegaly EXT:  2 plus pulses throughout, no edema, no cyanosis no clubbing  EKG:  EKG is  ordered today. The ekg ordered today demonstrates atrial fibrillation, rate 59, axis within normal limits, intervals within normal limits, no acute ST-T wave changes.     Recent Labs: 03/15/2022: BUN 14; Creatinine, Ser 0.57; Hemoglobin 14.9; Platelets 200; Potassium 4.3; Sodium 142    Lipid Panel No results found for: "CHOL", "TRIG", "HDL", "CHOLHDL", "VLDL", "LDLCALC", "LDLDIRECT"    Wt Readings from Last 3 Encounters:  08/03/22 194 lb 12.8 oz (88.4 kg)  03/22/22 200 lb 4.8 oz (90.9 kg)  03/15/22 200 lb 4.8 oz (90.9 kg)      Other studies Reviewed: Additional studies/ records that were reviewed today include: Labs Review of the above records demonstrates: See elsewhere  ASSESSMENT AND PLAN:  PERMANENT ATRIAL FIB:   Mr. Tanner Gust. has a CHA2DS2 - VASc score of 3.  He does not feel the fibrillation.  He tolerates anticoagulation.  I cautioned him to be very careful upon the  horses.  No change in therapy.   DYSLIPIDEMIA:   LDL is 116 with an HDL of 86.  No change in therapy.  AORTIC ATHEROSCLEROSIS: We are continue with primary risk reduction.  HTN:   The blood pressure is well controlled.  No change in therapy.  \  Current medicines are reviewed at length with the patient today.  The patient does not have concerns regarding medicines.  The following changes have been made: None  Labs/ tests ordered today include:   None  Orders Placed This Encounter  Procedures   EKG 12-Lead     Disposition:   FU  with me in one year.    Signed, Minus Breeding, MD  08/03/2022 10:30 AM    [place]

## 2022-08-03 ENCOUNTER — Encounter: Payer: Self-pay | Admitting: Cardiology

## 2022-08-03 ENCOUNTER — Ambulatory Visit: Payer: Federal, State, Local not specified - PPO | Attending: Cardiology | Admitting: Cardiology

## 2022-08-03 VITALS — BP 136/74 | HR 59 | Ht 71.0 in | Wt 194.8 lb

## 2022-08-03 DIAGNOSIS — I517 Cardiomegaly: Secondary | ICD-10-CM | POA: Diagnosis not present

## 2022-08-03 DIAGNOSIS — E785 Hyperlipidemia, unspecified: Secondary | ICD-10-CM | POA: Diagnosis not present

## 2022-08-03 DIAGNOSIS — I4821 Permanent atrial fibrillation: Secondary | ICD-10-CM

## 2022-08-03 NOTE — Patient Instructions (Signed)
  Follow-Up: At [facility], you and your health needs are our priority.  As part of our continuing mission to provide you with exceptional heart care, we have created designated Provider Care Teams.  These Care Teams include your primary Cardiologist (physician) and Advanced Practice Providers (APPs -  Physician Assistants and Nurse Practitioners) who all work together to provide you with the care you need, when you need it.  We recommend signing up for the patient portal called "MyChart".  Sign up information is provided on this After Visit Summary.  MyChart is used to connect with patients for Virtual Visits (Telemedicine).  Patients are able to view lab/test results, encounter notes, upcoming appointments, etc.  Non-urgent messages can be sent to your provider as well.   To learn more about what you can do with MyChart, go to [email or web address].    Your next appointment:   12 month(s)  The format for your next appointment:   In Person  Provider:   Minus Breeding MD

## 2022-08-06 ENCOUNTER — Other Ambulatory Visit (HOSPITAL_COMMUNITY): Payer: Self-pay | Admitting: Family Medicine

## 2022-08-06 DIAGNOSIS — R918 Other nonspecific abnormal finding of lung field: Secondary | ICD-10-CM

## 2022-08-22 ENCOUNTER — Ambulatory Visit (HOSPITAL_COMMUNITY)
Admission: RE | Admit: 2022-08-22 | Discharge: 2022-08-22 | Disposition: A | Discharge status: Home / Self Care | Payer: Federal, State, Local not specified - PPO | Source: Ambulatory Visit | Attending: Family Medicine | Admitting: Family Medicine

## 2022-08-22 DIAGNOSIS — R918 Other nonspecific abnormal finding of lung field: Secondary | ICD-10-CM | POA: Insufficient documentation

## 2022-08-22 LAB — GLUCOSE, CAPILLARY: Glucose-Capillary: 110 mg/dL — ABNORMAL HIGH (ref 70–99)

## 2022-08-22 MED ORDER — FLUDEOXYGLUCOSE F - 18 (FDG) INJECTION
8.7000 | Freq: Once | INTRAVENOUS | Status: AC | PRN
  Administered 2022-08-22: 9.6 via INTRAVENOUS

## 2022-09-25 ENCOUNTER — Other Ambulatory Visit: Payer: Self-pay | Admitting: Surgery

## 2022-10-01 NOTE — Pre-Procedure Instructions (Signed)
Surgical Instructions    Your procedure is scheduled on Thursday, October 04, 2022 at 12:30 PM.  Report to [facility] Main Entrance "A" at 10:30 A.M., then check in with the Admitting office.  Call this number if you have problems the morning of surgery:  [phone]   If you have any questions prior to your surgery date call [phone]: Open Monday-Friday 8am-4pm  *If you experience any cold or flu symptoms such as cough, fever, chills, shortness of breath, etc. between now and your scheduled surgery, please notify us.*    Remember:  Do not eat after midnight the night before your surgery  You may drink clear liquids until 9:30 AM the morning of your surgery.   Clear liquids allowed are: Water, Non-Citrus Juices (without pulp), Carbonated Beverages, Clear Tea, Black Coffee Only (NO MILK, CREAM OR POWDERED CREAMER of any kind), and Gatorade.  Patient Instructions  The night before surgery:  No food after midnight. ONLY clear liquids after midnight  The day of surgery (if you do NOT have diabetes):  Drink ONE (1) Pre-Surgery Clear Ensure by 9:30 AM the morning of surgery. Drink in one sitting. Do not sip.  This drink was given to you during your hospital  pre-op appointment visit.  Nothing else to drink after completing the  Pre-Surgery Clear Ensure.         If you have questions, please contact your surgeon's office.     Take these medicines the morning of surgery with A SIP OF WATER:  IF NEEDED Carboxymethylcellul-Glycerin (LUBRICATING EYE DROPS OP)  loratadine (CLARITIN)    Follow your surgeon's instructions on when to stop apixaban (ELIQUIS).  If no instructions were given by your surgeon then you will need to call the office to get those instructions.     As of today, STOP taking any Aspirin (unless otherwise instructed by your surgeon) Aleve, Naproxen, Ibuprofen, Motrin, Advil, Goody's, BC's, all herbal medications, fish oil, and all vitamins. This includes your  diclofenac sodium (VOLTAREN) 1 % GEL.                     Do NOT Smoke (Tobacco/Vaping) for 24 hours prior to your procedure.  If you use a CPAP at night, you may bring your mask/headgear for your overnight stay.   Contacts, glasses, piercing's, hearing aid's, dentures or partials may not be worn into surgery, please bring cases for these belongings.    For patients admitted to the hospital, discharge time will be determined by your treatment team.   Patients discharged the day of surgery will not be allowed to drive home, and someone needs to stay with them for 24 hours.  SURGICAL WAITING ROOM VISITATION Patients having surgery or a procedure may have two support people in the waiting area. Visitors may stay in the waiting area during the procedure and switch out with other visitors if needed. Only 1 support person is allowed in pre-op with the patient and they can not switch out with other visitors. Children under the age of 41 must have an adult accompany them who is not the patient. If the patient needs to stay at the hospital during part of their recovery, the visitor guidelines for inpatient rooms apply.  Please refer to the [facility] website for the visitor guidelines for Inpatients (after your surgery is over and you are in a regular room).    Special instructions:   [place]- Preparing For Surgery  Before surgery, you can play  an important role. Because skin is not sterile, your skin needs to be as free of germs as possible. You can reduce the number of germs on your skin by washing with CHG (chlorahexidine gluconate) Soap before surgery.  CHG is an antiseptic cleaner which kills germs and bonds with the skin to continue killing germs even after washing.    Oral Hygiene is also important to reduce your risk of infection.  Remember - BRUSH YOUR TEETH THE MORNING OF SURGERY WITH YOUR REGULAR TOOTHPASTE  Please do not use if you have an allergy to CHG or antibacterial soaps. If  your skin becomes reddened/irritated stop using the CHG.  Do not shave (including legs and underarms) for at least 48 hours prior to first CHG shower. It is OK to shave your face.  Please follow these instructions carefully.   Shower the NIGHT BEFORE SURGERY and the MORNING OF SURGERY  If you chose to wash your hair, wash your hair first as usual with your normal shampoo.  After you shampoo, rinse your hair and body thoroughly to remove the shampoo.  Use CHG Soap as you would any other liquid soap. You can apply CHG directly to the skin and wash gently with a scrungie or a clean washcloth.   Apply the CHG Soap to your body ONLY FROM THE NECK DOWN.  Do not use on open wounds or open sores. Avoid contact with your eyes, ears, mouth and genitals (private parts). Wash Face and genitals (private parts)  with your normal soap.   Wash thoroughly, paying special attention to the area where your surgery will be performed.  Thoroughly rinse your body with warm water from the neck down.  DO NOT shower/wash with your normal soap after using and rinsing off the CHG Soap.  Pat yourself dry with a CLEAN TOWEL.  Wear CLEAN PAJAMAS to bed the night before surgery  Place CLEAN SHEETS on your bed the night before your surgery  DO NOT SLEEP WITH PETS.   Day of Surgery: Take a shower with CHG soap. Do not wear jewelry. Do not wear lotions, powders, perfumes/colognes, or deodorant. Do not shave 48 hours prior to surgery.  Men may shave face and neck. Wear Clean/Comfortable clothing the morning of surgery Do not bring valuables to the hospital.  [facility] is not responsible for any belongings or valuables. Remember to brush your teeth WITH YOUR REGULAR TOOTHPASTE.   Please read over the following fact sheets that you were given.  If you received a COVID test during your pre-op visit  it is requested that you wear a mask when out in public, stay away from anyone that may not be feeling well and  notify your surgeon if you develop symptoms. If you have been in contact with anyone that has tested positive in the last 10 days please notify you surgeon.

## 2022-10-02 ENCOUNTER — Encounter (HOSPITAL_COMMUNITY): Payer: Self-pay

## 2022-10-02 ENCOUNTER — Other Ambulatory Visit: Payer: Self-pay

## 2022-10-02 ENCOUNTER — Encounter (HOSPITAL_COMMUNITY)
Admission: RE | Admit: 2022-10-02 | Discharge: 2022-10-02 | Disposition: A | Discharge status: Home / Self Care | Payer: Federal, State, Local not specified - PPO | Source: Ambulatory Visit | Attending: Surgery | Admitting: Surgery

## 2022-10-02 VITALS — BP 148/74 | HR 51 | Temp 97.6°F | Resp 18 | Ht 71.0 in | Wt 203.9 lb

## 2022-10-02 DIAGNOSIS — K219 Gastro-esophageal reflux disease without esophagitis: Secondary | ICD-10-CM | POA: Insufficient documentation

## 2022-10-02 DIAGNOSIS — Z01812 Encounter for preprocedural laboratory examination: Secondary | ICD-10-CM | POA: Insufficient documentation

## 2022-10-02 DIAGNOSIS — I081 Rheumatic disorders of both mitral and tricuspid valves: Secondary | ICD-10-CM | POA: Diagnosis not present

## 2022-10-02 DIAGNOSIS — Z85820 Personal history of malignant melanoma of skin: Secondary | ICD-10-CM | POA: Insufficient documentation

## 2022-10-02 DIAGNOSIS — Z87891 Personal history of nicotine dependence: Secondary | ICD-10-CM | POA: Insufficient documentation

## 2022-10-02 DIAGNOSIS — I4821 Permanent atrial fibrillation: Secondary | ICD-10-CM | POA: Insufficient documentation

## 2022-10-02 DIAGNOSIS — G4730 Sleep apnea, unspecified: Secondary | ICD-10-CM | POA: Diagnosis not present

## 2022-10-02 DIAGNOSIS — Z8546 Personal history of malignant neoplasm of prostate: Secondary | ICD-10-CM | POA: Insufficient documentation

## 2022-10-02 DIAGNOSIS — I2510 Atherosclerotic heart disease of native coronary artery without angina pectoris: Secondary | ICD-10-CM | POA: Diagnosis not present

## 2022-10-02 DIAGNOSIS — K449 Diaphragmatic hernia without obstruction or gangrene: Secondary | ICD-10-CM | POA: Diagnosis not present

## 2022-10-02 DIAGNOSIS — I4891 Unspecified atrial fibrillation: Secondary | ICD-10-CM | POA: Diagnosis not present

## 2022-10-02 DIAGNOSIS — R7303 Prediabetes: Secondary | ICD-10-CM | POA: Insufficient documentation

## 2022-10-02 DIAGNOSIS — Z7901 Long term (current) use of anticoagulants: Secondary | ICD-10-CM | POA: Insufficient documentation

## 2022-10-02 DIAGNOSIS — K4090 Unilateral inguinal hernia, without obstruction or gangrene, not specified as recurrent: Secondary | ICD-10-CM | POA: Insufficient documentation

## 2022-10-02 DIAGNOSIS — I517 Cardiomegaly: Secondary | ICD-10-CM | POA: Diagnosis not present

## 2022-10-02 DIAGNOSIS — I700 Atherosclerosis of aorta: Secondary | ICD-10-CM | POA: Diagnosis not present

## 2022-10-02 DIAGNOSIS — K5730 Diverticulosis of large intestine without perforation or abscess without bleeding: Secondary | ICD-10-CM | POA: Diagnosis not present

## 2022-10-02 DIAGNOSIS — Z01818 Encounter for other preprocedural examination: Secondary | ICD-10-CM

## 2022-10-02 DIAGNOSIS — D176 Benign lipomatous neoplasm of spermatic cord: Secondary | ICD-10-CM | POA: Diagnosis not present

## 2022-10-02 LAB — CBC
HCT: 42.8 % (ref 39.0–52.0)
Hemoglobin: 14.5 g/dL (ref 13.0–17.0)
MCH: 34.6 pg — ABNORMAL HIGH (ref 26.0–34.0)
MCHC: 33.9 g/dL (ref 30.0–36.0)
MCV: 102.1 fL — ABNORMAL HIGH (ref 80.0–100.0)
Platelets: 213 10*3/uL (ref 150–400)
RBC: 4.19 MIL/uL — ABNORMAL LOW (ref 4.22–5.81)
RDW: 13.5 % (ref 11.5–15.5)
WBC: 4.4 10*3/uL (ref 4.0–10.5)
nRBC: 0 % (ref 0.0–0.2)

## 2022-10-02 LAB — BASIC METABOLIC PANEL
Anion gap: 6 (ref 5–15)
BUN: 14 mg/dL (ref 8–23)
CO2: 27 mmol/L (ref 22–32)
Calcium: 9 mg/dL (ref 8.9–10.3)
Chloride: 106 mmol/L (ref 98–111)
Creatinine, Ser: 0.61 mg/dL (ref 0.61–1.24)
GFR, Estimated: >60 mL/min (ref >60)
Glucose, Bld: 99 mg/dL (ref 70–99)
Potassium: 4 mmol/L (ref 3.5–5.1)
Sodium: 139 mmol/L (ref 135–145)

## 2022-10-02 NOTE — Progress Notes (Signed)
PCP - Dr. Shirline Frees Cardiologist - Dr. Minus Breeding Urology: Dr. [name]  PPM/ICD - Denies  Chest x-ray - N/A EKG - 08/03/22 Stress Test - 02/23/16 ECHO - 02/13/16 Cardiac Cath - Denies  Sleep Study - Denies  Diabetes: Denies  Blood Thinner Instructions: LD: 10/01/22 Aspirin Instructions: N/A  ERAS Protcol - Yes, PRE-SURGERY Ensure  COVID TEST- N/A   Anesthesia review: Yes, cardiac hx; clearance 06/05/22 in Epic  Patient denies shortness of breath, fever, cough and chest pain at PAT appointment   All instructions explained to the patient, with a verbal understanding of the material. Patient agrees to go over the instructions while at home for a better understanding. Patient also instructed to self quarantine after being tested for COVID-19. The opportunity to ask questions was provided.

## 2022-10-03 NOTE — Anesthesia Preprocedure Evaluation (Signed)
Anesthesia Evaluation  Patient identified by MRN, date of birth, ID band Patient awake    Reviewed: Allergy & Precautions, NPO status , Patient's Chart, lab work & pertinent test results  History of Anesthesia Complications Negative for: history of anesthetic complications  Airway Mallampati: II  TM Distance: >3 FB Neck ROM: Full    Dental no notable dental hx. (+) Dental Advisory Given   Pulmonary sleep apnea , former smoker   Pulmonary exam normal        Cardiovascular + dysrhythmias Atrial Fibrillation  Rhythm:Irregular Rate:Normal   His preoperative cardiology input was previously outlined in 06/07/22 notation by Melina Copa, PA-C.  She wrote, ".Marland KitchenMarland KitchenTherefore, based on ACC/AHA guidelines, the patient would be at acceptable risk for the planned procedure without further cardiovascular testing. The patient was advised that if he develops new symptoms prior to surgery to contact our office to arrange for a follow-up visit, and he verbalized understanding..." He was given permission to hold Eliquis ford 3 days prior to surgery. He reported last dose 10/01/22.    Neuro/Psych negative neurological ROS     GI/Hepatic Neg liver ROS,GERD  ,,  Endo/Other  negative endocrine ROS    Renal/GU negative Renal ROS   prostate cancer (s/p robotic assisted laparoscopic radical prostatectomy, pelvic lymphadenectomy 11/09/13)    Musculoskeletal negative musculoskeletal ROS (+)    Abdominal   Peds  Hematology negative hematology ROS (+)   Anesthesia Other Findings   Reproductive/Obstetrics                             Anesthesia Physical Anesthesia Plan  ASA: 3  Anesthesia Plan: General   Post-op Pain Management: Celebrex PO (pre-op)*, Tylenol PO (pre-op)* and Regional block*   Induction: Intravenous  PONV Risk Score and Plan: 4 or greater and Ondansetron, Dexamethasone and Diphenhydramine  Airway  Management Planned: LMA  Additional Equipment:   Intra-op Plan:   Post-operative Plan: Extubation in OR  Informed Consent: I have reviewed the patients History and Physical, chart, labs and discussed the procedure including the risks, benefits and alternatives for the proposed anesthesia with the patient or authorized representative who has indicated his/her understanding and acceptance.     Dental advisory given  Plan Discussed with: Anesthesiologist and CRNA  Anesthesia Plan Comments: (PAT note written 10/03/2022 by Myra Gianotti, PA-C.  )       Anesthesia Quick Evaluation

## 2022-10-03 NOTE — H&P (Signed)
  REFERRING PHYSICIAN: Self  PROVIDER: Beverlee Nims, MD  MRN: [id number] DOB: 1941/03/17 DATE OF ENCOUNTER: 09/25/2022 Subjective  Chief Complaint: New Consultation (Eval of [facility])   History of Present Illness: Tanner Richardson is a 81 y.o. male who is seen today as an office consultation for evaluation of New Consultation (Eval of [facility]) .  This is a pleasant gentleman who I performed a right inguinal hernia repair with mesh on in 2016 who now presents with a left inguinal hernia. He noticed it back in October. It is gotten slightly larger and more uncomfortable especially when riding horses. He reports it will reduce when he lays down. He has had no obstructive symptoms. He has otherwise been doing well and has no cardiopulmonary issues other than A-fib. He is on Eliquis for this.  Review of Systems: A complete review of systems was obtained from the patient. I have reviewed this information and discussed as appropriate with the patient. See HPI as well for other ROS.  ROS  Medical History: Past Medical History: Diagnosis Date Arthritis Atrial fibrillation (CMS-HCC) Atrial fibrillation (CMS-HCC) GERD (gastroesophageal reflux disease) History of cancer  There is no problem list on file for this patient.  Past Surgical History: Procedure Laterality Date REPAIR INGUINAL HERNIA Right ROBOT ASSISTED LAPAROSCOPIC PROSTATECTOMY, RETROPUBIC RADICAL, INCLUDING NERVE SPARING TONSILLECTOMY   Allergies Allergen Reactions Meloxicam Unknown Blisters in the colon Poultry Nausea And Vomiting  Current Outpatient Medications on File Prior to Visit Medication Sig Dispense Refill acetaminophen (TYLENOL) 500 MG tablet Take by mouth ascorbic acid, vitamin C, (VITAMIN C) 250 MG tablet Take by mouth loratadine (CLARITIN) 10 mg tablet Take by mouth  No current facility-administered medications on file prior to visit.  History reviewed. No pertinent family history.  Social  History  Tobacco Use Smoking Status Not on file Smokeless Tobacco Not on file   Social History  Socioeconomic History Marital status: Married  Objective: There were no vitals filed for this visit. There is no height or weight on file to calculate BMI.  Physical Exam  He appears well on exam.  His abdomen is soft.  He has a moderate sized, slightly tender, but reducible left inguinal hernia.  Labs, Imaging and Diagnostic Testing: I have reviewed his notes in the electronic medical records.  Assessment and Plan:  Diagnoses and all orders for this visit:  Left inguinal hernia    He is well aware of hernias given his previous repair. We again discussed abdominal wall anatomy and the reasons to repair hernias. As he is symptomatic and on blood thinning medication, I recommend going ahead and proceeding with an open repair with mesh as well as his previous surgery. We again discussed the surgical procedure in detail. I discussed the risks which includes but is not limited to bleeding, infection, hernia recurrence, use of mesh, injury to surrounding structures, cardiopulmonary issues, postoperative recovery, etc. Surgery will be scheduled soon as possible.

## 2022-10-03 NOTE — Progress Notes (Signed)
Anesthesia Chart Review:  Case: [id number] Date/Time: 10/04/22 1215   Procedure: OPEN LEFT INGUINAL HERNIA REPAIR WITH MESH (Left)   Anesthesia type: General   Pre-op diagnosis: LEFT INGUINAL HERNIA   Location: [place] OR ROOM 12 / [place] OR   Surgeons: [name], MD       DISCUSSION: Patient is an 81 year old male scheduled for the above procedure.  History includes former smoker, persistent atrial fibrillation (s/p DCCV 05/30/16), prediabetes, GERD, melanoma, prostate cancer (s/p robotic assisted laparoscopic radical prostatectomy, pelvic lymphadenectomy 11/09/13), hernia (s/p right IHR 08/19/15). 03/22/22 bladder biopsy pathology benign urothelium and submucosa with Von Brunn's nests. Dr. Kenton Kingfisher is also following pulmonary nodules (see IMAGES below).   Last cardiology visit with Dr. Percival Spanish was on 08/03/2022.  Known permanent A-fib, tolerating anticoagulation therapy.  He cannot tell when he is in A-fib.  No change in therapy made.  1 year follow-up planned. His preoperative cardiology input was previously outlined in 06/07/22 notation by Melina Copa, PA-C.  She wrote, ".Marland KitchenMarland KitchenTherefore, based on ACC/AHA guidelines, the patient would be at acceptable risk for the planned procedure without further cardiovascular testing. The patient was advised that if he develops new symptoms prior to surgery to contact our office to arrange for a follow-up visit, and he verbalized understanding..." He was given permission to hold Eliquis ford 3 days prior to surgery. He reported last dose 10/01/22.   Anesthesia team to evaluate on the day of surgery.   VS: BP (!) 148/74   Pulse (!) 51   Temp 36.4 C (Oral)   Resp 18   Ht '5\' 11"'$  (1.803 m)   Wt 92.5 kg   SpO2 98%   BMI 28.44 kg/m    PROVIDERS: Shirline Frees, MD is PCP  Minus Breeding, MD is cardiologist [name], MD is urologist   LABS: Labs reviewed: Acceptable for surgery.  A1c 5.4% 03/15/2022. 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 - Insufficent early events to meet protocol requirements for split CVPAP titration. - No significant central sleep apnea occurred during this study (CAI = 0.0/h). - Oxygen desaturation was noted during this study (Min O2 = 80.00%), Mean saturation 94.4%.. - The patient snored with Loud snoring volume. - EKG findings include atrial fibrillation. - Clinically significant periodic limb movements did not occur during sleep. No significant associated arousals. - Sleep also disturbed by bathroom x 4   IMAGES: PET Scan 08/22/22: IMPRESSION: 1. 11 x 8 mm bilobed right middle lobe pulmonary nodule identified on recent diagnostic CT chest shows no hypermetabolism on PET imaging today. This along with the minimal interval change described on recent CT is reassuring for benign etiology. As well differentiated or low-grade neoplasm can be poorly FDG avid, continued surveillance likely warranted. The remaining pulmonary nodules seen previously are below size threshold for resolution on PET imaging. Left groin hernia contains a short segment of sigmoid colon without complicating features. 2. 5 mm nonobstructing right renal stone. 3. Left colonic diverticulosis without diverticulitis. Short segment of sigmoid colon is contained within left groin hernia without complicating features. 4. Tiny  hiatal hernia. Low level FDG accumulation in the distal esophagus may be related to esophagitis. 5.  Aortic Atherosclerosis (ICD10-I70.0).  CT Chest 07/18/22: IMPRESSION: 1. Bilobed versus 2 adjacent nodules in the perifissural right middle lobe measuring 11 x 8  mm, previously 10 x 6 mm in 2018. This has only minimally increased in size over the past 5 years. Smooth margins and lack of significant interval change favor a benign process. Given malignancy history, PET-CT could be considered for metabolic assessment. 2. Additional tiny bilateral pulmonary nodules are unchanged from 2018 and considered benign. No further follow-up of these nodules is needed. 3. Coronary artery calcifications. - Aortic atherosclerosis.   EKG: 08/03/2022 ([facility]): Atrial fibrillation with slow ventricular response, 59 bpm Septal infarct, age undetermined ST and T wave abnormality, consider inferolateral ischemia [present on 07/31/21 tracing as well]   CV: Echo 02/13/16 [facility]): Conclusions: 1.  Moderate concentric LVH with normal LV systolic function.  Estimated EF 55%. 2.  Moderate left atrial enlargement. 3.  Mild mitral gravitation. 4.  Mild tricuspid regurgitation. 5.  Trace pulmonic regurgitation.  Nuclear stress test 02/13/16 [facility]): Impression: 1.  Normal stress Myoview study with no evidence of ischemia or infarction. 2.  Normal quantitative gated SPECT ejection fraction 73% with normal wall motion and wall thickening. Recommendations: This is a low risk scan with no evidence of ischemia but reduced exercise tolerance.  We will continue treatment evaluation of atrial fibrillation.   Past Medical History:  Diagnosis Date   Arthritis    hands, "joints"   Atrial fibrillation ([place])    Dysrhythmia    pt. reports that Dr. Kenton Kingfisher (PCP) has done ekg's periodically for once seeing a "blip" on the tracing.    GERD (gastroesophageal reflux disease)    History of skin cancer    Pre-diabetes    Prostate cancer ([place])    also- clear margins on melanoma - removed 2013   Skin cancer    melanoma    Past Surgical History:  Procedure Laterality Date   CARDIOVERSION N/A 05/30/2016   Procedure: CARDIOVERSION;  Surgeon: Jacolyn Reedy, MD;  Location: [place];  Service: Cardiovascular;  Laterality: N/A;   COLONOSCOPY  2014   POLYP REMOVED   CYSTOSCOPY W/ URETERAL STENT PLACEMENT Right 03/22/2022   Procedure: CYSTOSCOPY WITH RETROGRADE PYELOGRAM/ URETEROSCOPY;  Surgeon: [name], MD;  Location: [facility];  Service: Urology;  Laterality: Right;   CYSTOSCOPY WITH BIOPSY N/A 03/22/2022   Procedure: CYSTOSCOPY WITH BLADDER  BIOPSY;  Surgeon: [name], MD;  Location: [facility];  Service: Urology;  Laterality: N/A;  ONLY NEEDS 60 MIN   INGUINAL HERNIA REPAIR Right 08/19/2015   Procedure: RIGHT INGUINAL HERNIA REPAIR;  Surgeon: [name], MD;  Location: [place];  Service: General;  Laterality: Right;   INSERTION OF MESH Right 08/19/2015   Procedure: INSERTION OF MESH;  Surgeon: [name], MD;  Location: [place];  Service: General;  Laterality: Right;   LYMPHADENECTOMY Bilateral 11/09/2013   Procedure: Noel Journey;  Surgeon: [name], MD;  Location: [facility];  Service: Urology;  Laterality: Bilateral;   MELANOMA EXCISION  2012   CHEST   ROBOT ASSISTED LAPAROSCOPIC RADICAL PROSTATECTOMY N/A 11/09/2013   Procedure: ROBOTIC ASSISTED LAPAROSCOPIC RADICAL PROSTATECTOMY LEVEL 2;  Surgeon: [name], MD;  Location: [facility];  Service: Urology;  Laterality: N/A;   TONSILLECTOMY      MEDICATIONS:  acetaminophen (TYLENOL) 500 MG tablet   apixaban (ELIQUIS) 5 MG TABS tablet   ascorbic acid (  VITAMIN C) 250 MG tablet   Carboxymethylcellul-Glycerin (LUBRICATING EYE DROPS OP)   Coenzyme Q10 (COQ10 PO)   cyanocobalamin 1000 MCG tablet   diclofenac sodium (VOLTAREN) 1 % GEL   Echinacea 208 MG CAPS   folic acid (FOLVITE) 1 MG tablet   Gluc-Chonn-MSM-Boswellia-Vit D (GLUCOSAMINE CHONDROITIN + D3 PO)   ibuprofen (ADVIL) 200 MG tablet   loratadine (CLARITIN) 10 MG tablet   Multiple Vitamin (MULTIVITAMIN WITH MINERALS) TABS tablet   Red Yeast Rice Extract (RED YEAST RICE PO)   No current facility-administered  medications for this encounter.    Myra Gianotti, PA-C Surgical Short Stay/Anesthesiology [facility] Phone [phone] [facility] Phone [phone] 10/03/2022 10:50 AM

## 2022-10-04 ENCOUNTER — Ambulatory Visit (HOSPITAL_COMMUNITY): Payer: Federal, State, Local not specified - PPO | Admitting: Certified Registered"

## 2022-10-04 ENCOUNTER — Ambulatory Visit (HOSPITAL_COMMUNITY)
Admission: RE | Admit: 2022-10-04 | Discharge: 2022-10-04 | Disposition: A | Discharge status: Home / Self Care | Payer: Federal, State, Local not specified - PPO | Attending: Surgery | Admitting: Surgery

## 2022-10-04 ENCOUNTER — Other Ambulatory Visit: Payer: Self-pay

## 2022-10-04 ENCOUNTER — Encounter (HOSPITAL_COMMUNITY)
Admission: RE | Disposition: A | Discharge status: Home / Self Care | Payer: Self-pay | Source: Home / Self Care | Attending: Surgery

## 2022-10-04 ENCOUNTER — Ambulatory Visit (HOSPITAL_COMMUNITY): Payer: Federal, State, Local not specified - PPO | Admitting: Vascular Surgery

## 2022-10-04 ENCOUNTER — Encounter (HOSPITAL_COMMUNITY): Payer: Self-pay | Admitting: Surgery

## 2022-10-04 DIAGNOSIS — K4090 Unilateral inguinal hernia, without obstruction or gangrene, not specified as recurrent: Secondary | ICD-10-CM | POA: Insufficient documentation

## 2022-10-04 DIAGNOSIS — I2510 Atherosclerotic heart disease of native coronary artery without angina pectoris: Secondary | ICD-10-CM | POA: Insufficient documentation

## 2022-10-04 DIAGNOSIS — Z87891 Personal history of nicotine dependence: Secondary | ICD-10-CM | POA: Insufficient documentation

## 2022-10-04 DIAGNOSIS — Z8546 Personal history of malignant neoplasm of prostate: Secondary | ICD-10-CM | POA: Insufficient documentation

## 2022-10-04 DIAGNOSIS — D176 Benign lipomatous neoplasm of spermatic cord: Secondary | ICD-10-CM | POA: Insufficient documentation

## 2022-10-04 DIAGNOSIS — I081 Rheumatic disorders of both mitral and tricuspid valves: Secondary | ICD-10-CM | POA: Insufficient documentation

## 2022-10-04 DIAGNOSIS — I4891 Unspecified atrial fibrillation: Secondary | ICD-10-CM | POA: Insufficient documentation

## 2022-10-04 DIAGNOSIS — Z7901 Long term (current) use of anticoagulants: Secondary | ICD-10-CM | POA: Insufficient documentation

## 2022-10-04 DIAGNOSIS — K5730 Diverticulosis of large intestine without perforation or abscess without bleeding: Secondary | ICD-10-CM | POA: Insufficient documentation

## 2022-10-04 DIAGNOSIS — K219 Gastro-esophageal reflux disease without esophagitis: Secondary | ICD-10-CM | POA: Insufficient documentation

## 2022-10-04 DIAGNOSIS — G4730 Sleep apnea, unspecified: Secondary | ICD-10-CM | POA: Insufficient documentation

## 2022-10-04 DIAGNOSIS — I700 Atherosclerosis of aorta: Secondary | ICD-10-CM | POA: Insufficient documentation

## 2022-10-04 DIAGNOSIS — K449 Diaphragmatic hernia without obstruction or gangrene: Secondary | ICD-10-CM | POA: Insufficient documentation

## 2022-10-04 DIAGNOSIS — I517 Cardiomegaly: Secondary | ICD-10-CM | POA: Insufficient documentation

## 2022-10-04 HISTORY — PX: INGUINAL HERNIA REPAIR: SHX194

## 2022-10-04 SURGERY — REPAIR, HERNIA, INGUINAL, ADULT
Anesthesia: General | Laterality: Left

## 2022-10-04 MED ORDER — FENTANYL CITRATE (PF) 250 MCG/5ML IJ SOLN
INTRAMUSCULAR | Status: AC
  Filled 2022-10-04: qty 5

## 2022-10-04 MED ORDER — FENTANYL CITRATE (PF) 250 MCG/5ML IJ SOLN
INTRAMUSCULAR | Status: DC | PRN
  Administered 2022-10-04: 50 ug via INTRAVENOUS

## 2022-10-04 MED ORDER — BUPIVACAINE LIPOSOME 1.3 % IJ SUSP
INTRAMUSCULAR | Status: DC | PRN
  Administered 2022-10-04: 10 mL

## 2022-10-04 MED ORDER — BUPIVACAINE-EPINEPHRINE (PF) 0.5% -1:200000 IJ SOLN
INTRAMUSCULAR | Status: AC
  Filled 2022-10-04: qty 30

## 2022-10-04 MED ORDER — BUPIVACAINE-EPINEPHRINE 0.5% -1:200000 IJ SOLN
INTRAMUSCULAR | Status: DC | PRN
  Administered 2022-10-04: 10 mL

## 2022-10-04 MED ORDER — ORAL CARE MOUTH RINSE: 15.0000 mL | Freq: Once | OROMUCOSAL | Status: AC

## 2022-10-04 MED ORDER — FENTANYL CITRATE (PF) 100 MCG/2ML IJ SOLN: 100.0000 ug | Freq: Once | INTRAMUSCULAR | Status: DC

## 2022-10-04 MED ORDER — CHLORHEXIDINE GLUCONATE CLOTH 2 % EX PADS: 6.0000 | MEDICATED_PAD | Freq: Once | CUTANEOUS | Status: DC

## 2022-10-04 MED ORDER — 0.9 % SODIUM CHLORIDE (POUR BTL) OPTIME
TOPICAL | Status: DC | PRN
  Administered 2022-10-04: 1000 mL

## 2022-10-04 MED ORDER — EPHEDRINE SULFATE-NACL 50-0.9 MG/10ML-% IV SOSY
PREFILLED_SYRINGE | INTRAVENOUS | Status: DC | PRN
  Administered 2022-10-04 (×3): 5 mg via INTRAVENOUS

## 2022-10-04 MED ORDER — LIDOCAINE 2% (20 MG/ML) 5 ML SYRINGE
INTRAMUSCULAR | Status: DC | PRN
  Administered 2022-10-04: 100 mg via INTRAVENOUS

## 2022-10-04 MED ORDER — BUPIVACAINE HCL (PF) 0.5 % IJ SOLN
INTRAMUSCULAR | Status: DC | PRN
  Administered 2022-10-04: 30 mL

## 2022-10-04 MED ORDER — TRAMADOL HCL 50 MG PO TABS: 50.0000 mg | ORAL_TABLET | Freq: Four times a day (QID) | ORAL | 0 refills | Status: AC | PRN

## 2022-10-04 MED ORDER — PROPOFOL 10 MG/ML IV BOLUS
INTRAVENOUS | Status: DC | PRN
  Administered 2022-10-04: 140 mg via INTRAVENOUS

## 2022-10-04 MED ORDER — CEFAZOLIN SODIUM-DEXTROSE 2-4 GM/100ML-% IV SOLN
2.0000 g | INTRAVENOUS | Status: AC
  Administered 2022-10-04: 2 g via INTRAVENOUS
  Filled 2022-10-04: qty 100

## 2022-10-04 MED ORDER — DEXAMETHASONE SODIUM PHOSPHATE 10 MG/ML IJ SOLN
INTRAMUSCULAR | Status: DC | PRN
  Administered 2022-10-04: 10 mg via INTRAVENOUS

## 2022-10-04 MED ORDER — PROMETHAZINE HCL 25 MG/ML IJ SOLN: 6.2500 mg | INTRAMUSCULAR | Status: DC | PRN

## 2022-10-04 MED ORDER — LACTATED RINGERS IV SOLN: INTRAVENOUS | Status: DC

## 2022-10-04 MED ORDER — ACETAMINOPHEN 500 MG PO TABS
1000.0000 mg | ORAL_TABLET | Freq: Once | ORAL | Status: AC
  Administered 2022-10-04: 1000 mg via ORAL
  Filled 2022-10-04: qty 2

## 2022-10-04 MED ORDER — FENTANYL CITRATE (PF) 100 MCG/2ML IJ SOLN
INTRAMUSCULAR | Status: AC
  Administered 2022-10-04: 100 ug
  Filled 2022-10-04: qty 2

## 2022-10-04 MED ORDER — AMISULPRIDE (ANTIEMETIC) 5 MG/2ML IV SOLN: 10.0000 mg | Freq: Once | INTRAVENOUS | Status: DC | PRN

## 2022-10-04 MED ORDER — MIDAZOLAM HCL 2 MG/2ML IJ SOLN
INTRAMUSCULAR | Status: AC
  Filled 2022-10-04: qty 2

## 2022-10-04 MED ORDER — FENTANYL CITRATE (PF) 100 MCG/2ML IJ SOLN
INTRAMUSCULAR | Status: AC
  Filled 2022-10-04: qty 2

## 2022-10-04 MED ORDER — FENTANYL CITRATE (PF) 100 MCG/2ML IJ SOLN
25.0000 ug | INTRAMUSCULAR | Status: DC | PRN
  Administered 2022-10-04: 50 ug via INTRAVENOUS

## 2022-10-04 MED ORDER — ENSURE PRE-SURGERY PO LIQD: 296.0000 mL | Freq: Once | ORAL | Status: DC

## 2022-10-04 MED ORDER — ONDANSETRON HCL 4 MG/2ML IJ SOLN
INTRAMUSCULAR | Status: DC | PRN
  Administered 2022-10-04: 4 mg via INTRAVENOUS

## 2022-10-04 MED ORDER — CHLORHEXIDINE GLUCONATE 0.12 % MT SOLN
15.0000 mL | Freq: Once | OROMUCOSAL | Status: AC
  Administered 2022-10-04: 15 mL via OROMUCOSAL
  Filled 2022-10-04: qty 15

## 2022-10-04 SURGICAL SUPPLY — 36 items
BAG COUNTER SPONGE SURGICOUNT (BAG) IMPLANT
BLADE CLIPPER SURG (BLADE) IMPLANT
CHLORAPREP W/TINT 26 (MISCELLANEOUS) ×1 IMPLANT
COVER SURGICAL LIGHT HANDLE (MISCELLANEOUS) ×1 IMPLANT
DERMABOND ADVANCED .7 DNX12 (GAUZE/BANDAGES/DRESSINGS) ×1 IMPLANT
DRAIN PENROSE 1/2X12 LTX STRL (WOUND CARE) IMPLANT
DRAPE LAPAROTOMY TRNSV 102X78 (DRAPES) ×1 IMPLANT
ELECT REM PT RETURN 9FT ADLT (ELECTROSURGICAL) ×1
ELECTRODE REM PT RTRN 9FT ADLT (ELECTROSURGICAL) ×1 IMPLANT
GLOVE BIO SURGEON STRL SZ 6.5 (GLOVE) IMPLANT
GLOVE BIOGEL PI IND STRL 7.0 (GLOVE) IMPLANT
GLOVE SURG SIGNA 7.5 PF LTX (GLOVE) ×1 IMPLANT
GOWN STRL REUS W/ TWL LRG LVL3 (GOWN DISPOSABLE) ×1 IMPLANT
GOWN STRL REUS W/ TWL XL LVL3 (GOWN DISPOSABLE) ×1 IMPLANT
GOWN STRL REUS W/TWL LRG LVL3 (GOWN DISPOSABLE) ×2
GOWN STRL REUS W/TWL XL LVL3 (GOWN DISPOSABLE) ×1
KIT BASIN OR (CUSTOM PROCEDURE TRAY) ×1 IMPLANT
KIT TURNOVER KIT B (KITS) ×1 IMPLANT
MESH PARIETEX PROGRIP LEFT (Mesh General) IMPLANT
NDL HYPO 25GX1X1/2 BEV (NEEDLE) ×1 IMPLANT
NEEDLE HYPO 25GX1X1/2 BEV (NEEDLE) ×1 IMPLANT
NS IRRIG 1000ML POUR BTL (IV SOLUTION) ×1 IMPLANT
PACK GENERAL/GYN (CUSTOM PROCEDURE TRAY) ×1 IMPLANT
PAD ARMBOARD 7.5X6 YLW CONV (MISCELLANEOUS) ×1 IMPLANT
SUT MNCRL AB 4-0 PS2 18 (SUTURE) IMPLANT
SUT MON AB 4-0 PC3 18 (SUTURE) ×1 IMPLANT
SUT SILK 2 0 SH (SUTURE) IMPLANT
SUT VIC AB 2-0 CT1 27 (SUTURE) ×2
SUT VIC AB 2-0 CT1 TAPERPNT 27 (SUTURE) ×1 IMPLANT
SUT VIC AB 3-0 CT1 27 (SUTURE)
SUT VIC AB 3-0 CT1 TAPERPNT 27 (SUTURE) ×1 IMPLANT
SUT VIC AB 3-0 SH 27 (SUTURE) ×1
SUT VIC AB 3-0 SH 27X BRD (SUTURE) IMPLANT
SYR CONTROL 10ML LL (SYRINGE) ×1 IMPLANT
TOWEL GREEN STERILE (TOWEL DISPOSABLE) ×1 IMPLANT
TOWEL GREEN STERILE FF (TOWEL DISPOSABLE) ×1 IMPLANT

## 2022-10-04 NOTE — Interval H&P Note (Signed)
History and Physical Interval Note:no change in H and P  10/04/2022 11:34 AM  Tanner Richardson.  has presented today for surgery, with the diagnosis of LEFT INGUINAL HERNIA.  The various methods of treatment have been discussed with the patient and family. After consideration of risks, benefits and other options for treatment, the patient has consented to  Procedure(s): OPEN LEFT INGUINAL HERNIA REPAIR WITH MESH (Left) as a surgical intervention.  The patient's history has been reviewed, patient examined, no change in status, stable for surgery.  I have reviewed the patient's chart and labs.  Questions were answered to the patient's satisfaction.     [name]

## 2022-10-04 NOTE — Anesthesia Procedure Notes (Signed)
Procedure Name: LMA Insertion Date/Time: 10/04/2022 12:42 PM  Performed by: Mariea Clonts, CRNAPre-anesthesia Checklist: Patient identified, Emergency Drugs available, Suction available and Patient being monitored Patient Re-evaluated:Patient Re-evaluated prior to induction Oxygen Delivery Method: Circle System Utilized Preoxygenation: Pre-oxygenation with 100% oxygen Induction Type: IV induction Ventilation: Mask ventilation without difficulty LMA: LMA inserted LMA Size: 5.0 Number of attempts: 1 Airway Equipment and Method: Bite block Placement Confirmation: positive ETCO2 Tube secured with: Tape Dental Injury: Teeth and Oropharynx as per pre-operative assessment

## 2022-10-04 NOTE — Transfer of Care (Signed)
Immediate Anesthesia Transfer of Care Note  Patient: Tanner Richardson.  Procedure(s) Performed: OPEN LEFT INGUINAL HERNIA REPAIR WITH PROGRIP MESH (Left)  Patient Location: PACU  Anesthesia Type:GA combined with regional for post-op pain  Level of Consciousness: awake, alert , and oriented  Airway & Oxygen Therapy: Patient Spontanous Breathing and Patient connected to nasal cannula oxygen  Post-op Assessment: Report given to RN, Post -op Vital signs reviewed and stable, and Patient moving all extremities X 4  Post vital signs: Reviewed and stable  Last Vitals:  Vitals Value Taken Time  BP    Temp    Pulse    Resp    SpO2      Last Pain:  Vitals:   10/04/22 1121  TempSrc:   PainSc: 0-No pain         Complications: No notable events documented.

## 2022-10-04 NOTE — Op Note (Signed)
   [place]. 10/04/2022   Pre-op Diagnosis: LEFT INGUINAL HERNIA     Post-op Diagnosis: same  Procedure(s): OPEN LEFT INGUINAL HERNIA REPAIR WITH  MESH  Surgeon(s): [name], MD  Anesthesia: General  Staff:  Circulator: Dyann Ruddle, RN Relief Scrub: Gladstone Lighter, RN Scrub Person: Baldemar Lenis, RN Circulator Assistant: Baldemar Lenis, RN; Gladstone Lighter, RN; Jamie Kato  Estimated Blood Loss: Minimal               Findings: The patient was found of an indirect left inguinal hernia which was repaired with a large piece of Prolene ProGrip mesh from [employer]  Procedure: The patient was identified in the preoperative holding area and under ultrasound guidance anesthesiology did a left-sided tap block.  He was then taken to the operating room.  He was placed upon the operating table general esthesia was induced.  His left lower quadrant and inguinal area then prepped and draped in usual sterile fashion.  I anesthetized skin with Marcaine with epinephrine.  I then made a longitudinal incision with a scalpel.  I then dissected down through Scarpa's fascia with the cautery.  I then identified the external oblique fascia and opened it toward the internal and external rings.  I next controlled the testicular cord structures with a Penrose drain.  The patient had a lipoma of the cord which I excised.  He also had an indirect hernia sac.  Open of the sac and all contents were reduced back into the abdominal cavity.  I then tied off the base of the sac with a pursestring 2-0 silk suture.  Next a large piece of Prolene ProGrip mesh was brought to the field.  I placed it against the pubic tubercle and the brought around the cord structures covering the internal ring.  I then sutured the mesh in place with 2 separate 2-0 Vicryl sutures with 1 to the shelving edge of the inguinal ligament and 1 to the pubic tubercle.  Wide coverage of the inguinal floor appeared to  be achieved.  I then closed the external oblique fascia with a running 2-0 Vicryl suture.  Scarpa's fascia was closed interrupted 3-0 Vicryl sutures and the skin was closed with a running 4-0 Monocryl.  Dermabond was then applied.  The patient tolerated the procedure well.  All the counts were correct at the end of the procedure.  The patient was then extubated in the operating room and taken in a stable condition to the recovery room.          [name]   Date: 10/04/2022  Time: 1:21 PM

## 2022-10-04 NOTE — Discharge Instructions (Signed)
[facility] _______[facility]  UMBILICAL OR INGUINAL HERNIA REPAIR: POST OP INSTRUCTIONS  Always review your discharge instruction sheet given to you by the facility where your surgery was performed. IF YOU HAVE DISABILITY OR FAMILY LEAVE FORMS, YOU MUST BRING THEM TO THE OFFICE FOR PROCESSING.   DO NOT GIVE THEM TO YOUR DOCTOR.  1. A  prescription for pain medication may be given to you upon discharge.  Take your pain medication as prescribed, if needed.  If narcotic pain medicine is not needed, then you may take acetaminophen (Tylenol) or ibuprofen (Advil) as needed. 2. Take your usually prescribed medications unless otherwise directed. If you need a refill on your pain medication, please contact your pharmacy.  They will contact our office to request authorization. Prescriptions will not be filled after 5 pm or on week-ends. 3. You should follow a light diet the first 24 hours after arrival home, such as soup and crackers, etc.  Be sure to include lots of fluids daily.  Resume your normal diet the day after surgery. 4.Most patients will experience some swelling and bruising around the umbilicus or in the groin and scrotum.  Ice packs and reclining will help.  Swelling and bruising can take several days to resolve.  6. It is common to experience some constipation if taking pain medication after surgery.  Increasing fluid intake and taking a stool softener (such as Colace) will usually help or prevent this problem from occurring.  A mild laxative (Milk of Magnesia or Miralax) should be taken according to package directions if there are no bowel movements after 48 hours. 7. Unless discharge instructions indicate otherwise, you may remove your bandages 24-48 hours after surgery, and you may shower at that time.  You may have steri-strips (small skin tapes) in place directly over the incision.  These strips should be left on the skin for 7-10 days.  If your surgeon used skin glue on the  incision, you may shower in 24 hours.  The glue will flake off over the next 2-3 weeks.  Any sutures or staples will be removed at the office during your follow-up visit. 8. ACTIVITIES:  You may resume regular (light) daily activities beginning the next day--such as daily self-care, walking, climbing stairs--gradually increasing activities as tolerated.  You may have sexual intercourse when it is comfortable.  Refrain from any heavy lifting or straining until approved by your doctor.  a.You may drive when you are no longer taking prescription pain medication, you can comfortably wear a seatbelt, and you can safely maneuver your car and apply brakes. b.RETURN TO WORK:   _____________________________________________  9.You should see your doctor in the office for a follow-up appointment approximately 2-3 weeks after your surgery.  Make sure that you call for this appointment within a day or two after you arrive home to insure a convenient appointment time. 10.OTHER INSTRUCTIONS: _no lifting more than 15 pounds for 4 weeks Ok to shower starting tomorrow Ice pack, tylenol, and ibuprofen also for pain________________________    _____________________________________  WHEN TO CALL YOUR DOCTOR: Fever over 101.0 Inability to urinate Nausea and/or vomiting Extreme swelling or bruising Continued bleeding from incision. Increased pain, redness, or drainage from the incision  The clinic staff is available to answer your questions during regular business hours.  Please don't hesitate to call and ask to speak to one of the nurses for clinical concerns.  If you have a medical emergency, go to the nearest emergency room or call 911.  A surgeon from [facility] is always on call at the hospital   [address] [place], [place], [place]  [address] ?  [address], [place], [place]   [address] [phone] ? [phone] ? FAX [phone] Web site: [email or web address]

## 2022-10-04 NOTE — Anesthesia Postprocedure Evaluation (Signed)
Anesthesia Post Note  Patient: Tanner Richardson.  Procedure(s) Performed: OPEN LEFT INGUINAL HERNIA REPAIR WITH PROGRIP MESH (Left)     Patient location during evaluation: PACU Anesthesia Type: General Level of consciousness: awake and alert, patient cooperative and oriented Pain management: pain level controlled Vital Signs Assessment: post-procedure vital signs reviewed and stable Respiratory status: spontaneous breathing, nonlabored ventilation and respiratory function stable Cardiovascular status: blood pressure returned to baseline and stable Postop Assessment: no apparent nausea or vomiting Anesthetic complications: no   No notable events documented.  Last Vitals:  Vitals:   10/04/22 1335 10/04/22 1345  BP:  (!) 160/70  Pulse: (!) 56 (!) 54  Resp: 14 19  Temp:    SpO2: 98% 95%    Last Pain:  Vitals:   10/04/22 1345  TempSrc:   PainSc: 2                  Bartholomew Ramesh,E. Dennisha Mouser

## 2022-10-04 NOTE — Anesthesia Procedure Notes (Signed)
Anesthesia Regional Block: TAP block   Pre-Anesthetic Checklist: , timeout performed,  Correct Patient, Correct Site, Correct Laterality,  Correct Procedure, Correct Position, site marked,  Risks and benefits discussed,  Surgical consent,  Pre-op evaluation,  At surgeon's request and post-op pain management  Laterality: Left  Prep: chloraprep       Needles:  Injection technique: Single-shot  Needle Type: Echogenic Stimulator Needle     Needle Length: 10cm  Needle Gauge: 21     Additional Needles:   Procedures:,,,, ultrasound used (permanent image in chart),,    Narrative:  Start time: 10/04/2022 11:37 AM End time: 10/04/2022 11:47 AM Injection made incrementally with aspirations every 5 mL.  Performed by: Personally  Anesthesiologist: Duane Boston, MD

## 2022-10-05 ENCOUNTER — Encounter (HOSPITAL_COMMUNITY): Payer: Self-pay | Admitting: Surgery

## 2022-11-29 ENCOUNTER — Other Ambulatory Visit: Payer: Self-pay | Admitting: Cardiology

## 2022-11-29 NOTE — Telephone Encounter (Signed)
Prescription refill request for Eliquis received. Indication:  AF Last office visit: 08/03/22  Vita Barley MD Scr: 0.61 on 10/02/22 Age: 82  Weight: 88.4kg  Based on above findings Eliquis '5mg'$  twice daily is the appropriate dose.  Refill approved.

## 2023-01-13 ENCOUNTER — Encounter (HOSPITAL_BASED_OUTPATIENT_CLINIC_OR_DEPARTMENT_OTHER): Payer: Self-pay

## 2023-01-13 ENCOUNTER — Emergency Department (HOSPITAL_BASED_OUTPATIENT_CLINIC_OR_DEPARTMENT_OTHER): Payer: Federal, State, Local not specified - PPO

## 2023-01-13 ENCOUNTER — Emergency Department (HOSPITAL_BASED_OUTPATIENT_CLINIC_OR_DEPARTMENT_OTHER)
Admission: EM | Admit: 2023-01-13 | Discharge: 2023-01-13 | Disposition: A | Discharge status: Home / Self Care | Payer: Federal, State, Local not specified - PPO | Attending: Emergency Medicine | Admitting: Emergency Medicine

## 2023-01-13 DIAGNOSIS — S7011XA Contusion of right thigh, initial encounter: Secondary | ICD-10-CM | POA: Diagnosis present

## 2023-01-13 DIAGNOSIS — W5512XA Struck by horse, initial encounter: Secondary | ICD-10-CM | POA: Insufficient documentation

## 2023-01-13 DIAGNOSIS — S60222A Contusion of left hand, initial encounter: Secondary | ICD-10-CM | POA: Insufficient documentation

## 2023-01-13 DIAGNOSIS — Z7901 Long term (current) use of anticoagulants: Secondary | ICD-10-CM | POA: Diagnosis not present

## 2023-01-13 DIAGNOSIS — T148XXA Other injury of unspecified body region, initial encounter: Secondary | ICD-10-CM

## 2023-01-13 LAB — BASIC METABOLIC PANEL
Anion gap: 5 (ref 5–15)
BUN: 14 mg/dL (ref 8–23)
CO2: 28 mmol/L (ref 22–32)
Calcium: 8.8 mg/dL — ABNORMAL LOW (ref 8.9–10.3)
Chloride: 106 mmol/L (ref 98–111)
Creatinine, Ser: 0.53 mg/dL — ABNORMAL LOW (ref 0.61–1.24)
GFR, Estimated: >60 mL/min (ref >60)
Glucose, Bld: 101 mg/dL — ABNORMAL HIGH (ref 70–99)
Potassium: 4 mmol/L (ref 3.5–5.1)
Sodium: 139 mmol/L (ref 135–145)

## 2023-01-13 LAB — CBC WITH DIFFERENTIAL/PLATELET
Abs Immature Granulocytes: 0.01 10*3/uL (ref 0.00–0.07)
Basophils Absolute: 0 10*3/uL (ref 0.0–0.1)
Basophils Relative: 1 %
Eosinophils Absolute: 0.1 10*3/uL (ref 0.0–0.5)
Eosinophils Relative: 1 %
HCT: 32 % — ABNORMAL LOW (ref 39.0–52.0)
Hemoglobin: 10.7 g/dL — ABNORMAL LOW (ref 13.0–17.0)
Immature Granulocytes: 0 %
Lymphocytes Relative: 22 %
Lymphs Abs: 1 10*3/uL (ref 0.7–4.0)
MCH: 33.8 pg (ref 26.0–34.0)
MCHC: 33.4 g/dL (ref 30.0–36.0)
MCV: 100.9 fL — ABNORMAL HIGH (ref 80.0–100.0)
Monocytes Absolute: 0.5 10*3/uL (ref 0.1–1.0)
Monocytes Relative: 10 %
Neutro Abs: 3 10*3/uL (ref 1.7–7.7)
Neutrophils Relative %: 66 %
Platelets: 209 10*3/uL (ref 150–400)
RBC: 3.17 MIL/uL — ABNORMAL LOW (ref 4.22–5.81)
RDW: 13.6 % (ref 11.5–15.5)
WBC: 4.6 10*3/uL (ref 4.0–10.5)
nRBC: 0 % (ref 0.0–0.2)

## 2023-01-13 MED ORDER — IOHEXOL 300 MG/ML  SOLN
100.0000 mL | Freq: Once | INTRAMUSCULAR | Status: AC | PRN
  Administered 2023-01-13: 80 mL via INTRAVENOUS

## 2023-01-13 NOTE — ED Provider Notes (Signed)
I provided a substantive portion of the care of this patient.  I personally made/approved the management plan for this patient and take responsibility for the patient management.   82 year old male who was kicked in his right thigh 3 days ago by a horse.  He is on Eliquis.  On exam, he has ecchymosis at his distal medial nerve.  He has no evidence of compartment syndrome.  He has no neurovascular compromise to his right foot.  Will perform blood work and CT image.   Lacretia Leigh, MD 01/13/23 1002

## 2023-01-13 NOTE — ED Notes (Signed)
Cleansed right inner hematoma wound. Dressed with Telfa and 4x4 gauze.

## 2023-01-13 NOTE — ED Triage Notes (Signed)
States was hit by horse hoof.  Bruising to left hand.  Large about of bruising to right inner leg with hard raised hematoma.  Good pedal pulse and cap refill to foot. Ambulatory to room without difficulty. States on blood thinners

## 2023-01-13 NOTE — Discharge Instructions (Addendum)
It was a pleasure take care of you today!  The labs are unremarkable.  Your CT scan showed a hematoma to the right thigh.  There is no fracture or dislocation.  It is important that you call your primary care provider tomorrow to set up a follow-up appointment regarding today's ED visit.  You may place ice to the affected area for up to 15-20 minutes at a time, ensure to place a barrier between your skin and the ice/heat. Return to the ED if you are experiencing increasing/worsening pain, swelling, bruising, inability to walk, inability to move leg, or worsening symptoms.

## 2023-01-13 NOTE — ED Provider Notes (Signed)
[facility] Provider Note   CSN: [id number] Arrival date & time: 01/13/23  [id number]     History  Chief Complaint  Patient presents with   Leg Injury    Mordechai W Ronen Kammer. is a 82 y.o. male who presents emergency department with concerns for right leg injury onset 3 days.  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 Has associated left hand pain/bruising, right thigh contusion/pain.  Tried Tylenol at home for the symptoms. Denies chest pain, shortness of breath, abdominal pain, nausea, vomiting, hitting his head, LOC, urinary symptoms.  The history is provided by the patient. No language interpreter was used.       Home Medications Prior to Admission medications   Medication Sig Start Date End Date Taking? Authorizing Provider  acetaminophen (TYLENOL) 500 MG tablet Take 1,000 mg by mouth in the morning, at noon, and at bedtime.    [provider]  ascorbic acid (VITAMIN C) 250 MG tablet Take 250 mg by mouth daily.    [provider]  Carboxymethylcellul-Glycerin (LUBRICATING EYE DROPS OP) Place 1 drop into both eyes daily as needed (irritation).    [provider]  Coenzyme Q10 (COQ10 PO) Take 300 mg by mouth daily.    [provider]  cyanocobalamin 1000 MCG tablet Take 1,000 mcg by mouth daily.    [provider]  diclofenac sodium (VOLTAREN) 1 % GEL Apply 1 application. topically 4 (four) times daily as needed (pain). 07/29/18   [provider]  Echinacea 400 MG CAPS Take 400 mg by mouth daily.    [provider]  ELIQUIS 5 MG TABS tablet TAKE 1 TABLET BY MOUTH 2 TIMES DAILY 11/29/22   Minus Breeding, MD  folic acid (FOLVITE) 1 MG tablet Take 1 mg by mouth daily.    [provider]  Gluc-Chonn-MSM-Boswellia-Vit D (GLUCOSAMINE CHONDROITIN + D3 PO) Take  1-2 tablets by mouth See admin instructions. Take 2 tablets by mouth in the morning and and 1 tablet in the evening    [provider]  ibuprofen (ADVIL) 200 MG tablet Take 200 mg by mouth every 6 (six) hours as needed for moderate pain.    [provider]  loratadine (CLARITIN) 10 MG tablet Take 10 mg by mouth daily as needed for allergies.    [provider]  Multiple Vitamin (MULTIVITAMIN WITH MINERALS) TABS tablet Take 1 tablet by mouth daily.    [provider]  Red Yeast Rice Extract (RED YEAST RICE PO) Take 1 capsule by mouth daily.    [provider]  traMADol (ULTRAM) 50 MG tablet Take 1-2 tablets (50-100 mg total) by mouth every 6 (six) hours as needed for moderate pain. 10/04/22   [name], MD      Allergies    Meloxicam and Poultry meal    Review of Systems   Review of Systems  All other systems reviewed and are negative.   Physical Exam Updated Vital Signs BP (!) 155/85 (BP Location: Right Arm)   Pulse (!) 58   Temp 98.4 F (36.9 C) (Oral)   Resp 18   Ht 5\' 11"  (1.803 m)   Wt 87.5 kg   SpO2 97%   BMI 26.92 kg/m  Physical Exam Vitals and nursing note reviewed.  Constitutional:      General: He  is not in acute distress.    Appearance: He is not diaphoretic.  HENT:     Head: Normocephalic and atraumatic.     Mouth/Throat:     Pharynx: No oropharyngeal exudate.  Eyes:     General: No scleral icterus.    Conjunctiva/sclera: Conjunctivae normal.  Cardiovascular:     Rate and Rhythm: Normal rate and regular rhythm.     Pulses: Normal pulses.     Heart sounds: Normal heart sounds.  Pulmonary:     Effort: Pulmonary effort is normal. No respiratory distress.     Breath sounds: Normal breath sounds. No wheezing.  Abdominal:     General: Bowel sounds are normal.     Palpations: Abdomen is soft. There is no mass.     Tenderness: There is no abdominal tenderness. There is no guarding or rebound.  Musculoskeletal:         General: Normal range of motion.     Cervical back: Normal range of motion and neck supple.     Comments: Contusion noted to anterior and medial aspect of right thigh with hematoma noted to anterior mid thigh. Weeping noted to the anterior right thigh. Pedal pulses intact. Able to flex/extend right knee without difficulty. No TTP noted to left hand. Ecchymosis noted to anterior aspect of left hand. Grip strength 5/5. Radial pulse intact.  Compartments soft.  Full range of motion of all extremities.  Skin:    General: Skin is warm and dry.  Neurological:     Mental Status: He is alert.  Psychiatric:        Behavior: Behavior normal.     ED Results / Procedures / Treatments   Labs (all labs ordered are listed, but only abnormal results are displayed) Labs Reviewed  BASIC METABOLIC PANEL - Abnormal; Notable for the following components:      Result Value   Glucose, Bld 101 (*)    Creatinine, Ser 0.53 (*)    Calcium 8.8 (*)    All other components within normal limits  CBC WITH DIFFERENTIAL/PLATELET - Abnormal; Notable for the following components:   RBC 3.17 (*)    Hemoglobin 10.7 (*)    HCT 32.0 (*)    MCV 100.9 (*)    All other components within normal limits    EKG None  Radiology CT FEMUR RIGHT W CONTRAST  Result Date: 01/13/2023 CLINICAL DATA:  Upper leg trauma EXAM: CT OF THE LOWER RIGHT EXTREMITY WITH CONTRAST TECHNIQUE: Multidetector CT imaging of the lower right extremity was performed according to the standard protocol following intravenous contrast administration. RADIATION DOSE REDUCTION: This exam was performed according to the departmental dose-optimization program which includes automated exposure control, adjustment of the mA and/or kV according to patient size and/or use of iterative reconstruction technique. CONTRAST:  24mL OMNIPAQUE IOHEXOL 300 MG/ML  SOLN COMPARISON:  None Available. FINDINGS: Bones/Joint/Cartilage There is no evidence of acute fracture.  Alignment is normal. 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 Mild degenerative changes of the pubic symphysis. Tricompartment osteoarthritis of the knee. No significant joint effusion. Ligaments Suboptimally assessed by CT. Muscles and Tendons No acute myotendinous abnormality by CT. Soft tissues There is extensive soft tissue swelling of the right thigh most prominent medially. There is a large hyperdense, non-enhancing collection in the subcutaneous tissues of the medial thigh measuring up to 9.6 x 5.3 x 10.5 cm (series 6, image 135, series 10, image 22). Sigmoid diverticulosis noted in the pelvis. There is also a large right-sided hydrocele  in the scrotum which is partially visualized. IMPRESSION: Large hematoma along the right medial thigh measuring up to 9.6 x 5.3 x 10.5 cm. Extensive soft tissue swelling of the thigh. No evidence of acute fracture. Recommend clinical follow-up. Electronically Signed   By: Maurine Simmering M.D.   On: 01/13/2023 12:28   DG Hand Complete Left  Result Date: 01/13/2023 CLINICAL DATA:  Left hand pain EXAM: LEFT HAND - COMPLETE 3+ VIEW COMPARISON:  None Available. FINDINGS: There is no evidence of acute fracture. Alignment is normal. There is mild radiocarpal and moderate base of thumb osteoarthritis. There is mild-to-moderate diffuse interphalangeal joint osteoarthritis. No bone erosion or periostitis. No chondrocalcinosis. Soft tissues appear unremarkable radiographically. IMPRESSION: No acute osseous abnormality. 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 Electronically Signed   By: Maurine Simmering M.D.   On: 01/13/2023 10:56    Procedures Procedures    Medications Ordered in ED Medications  iohexol (OMNIPAQUE) 300 MG/ML solution 100 mL (80 mLs Intravenous Contrast Given 01/13/23 1143)    ED Course/ Medical Decision Making/ A&P                             Medical Decision Making Amount and/or Complexity of Data Reviewed Labs: ordered. Radiology:  ordered.  Risk Prescription drug management.   Pt presents with right leg injury onset 3 days. Vital signs, pt afebrile. On exam, pt with Contusion noted to anterior and medial aspect of right thigh with hematoma noted to anterior mid thigh. Weeping noted to the anterior right thigh. Pedal pulses intact. Able to flex/extend right knee without difficulty. No TTP noted to left hand. Ecchymosis noted to anterior aspect of left hand. Grip strength 5/5. Radial pulse intact. No acute cardiovascular, respiratory, abdominal exam findings. Differential diagnosis includes contusion, hematoma, compartment syndrome.    Co morbidities that complicate the patient evaluation: AF  Additional history obtained:  Additional history obtained from Spouse/Significant Other  Labs:  I ordered, and personally interpreted labs.  The pertinent results include:   CBC without leukocytosis BMP unremarkable  Imaging: I ordered imaging studies including CT Femur w  I independently visualized and interpreted imaging which showed:  Large hematoma along the right medial thigh measuring up to 9.6 x  5.3 x 10.5 cm. Extensive soft tissue swelling of the thigh. No  evidence of acute fracture. Recommend clinical follow-up.   I agree with the radiologist interpretation    Disposition: Presentation suspicious for hematoma and contusion of right thigh.  Doubt concerns at this time for fracture, dislocation.  Doubt concerns at this time for compartment syndrome, compartments are soft, patient with full active range of motion of right lower extremity. After consideration of the diagnostic results and the patients response to treatment, I feel that the patient would benefit from Discharge home.  Discussed with patient and wife at bedside regarding strict return precautions consistent of increasing pain, bruising, swelling, decreased range of motion. Supportive care measures and strict return precautions discussed with patient at  bedside. Pt acknowledges and verbalizes understanding. Pt appears safe for discharge. Follow up as indicated in discharge paperwork.    This chart was dictated using voice recognition software, Dragon. Despite the best efforts of this provider to proofread and correct errors, errors may still occur which can change documentation meaning.  Final Clinical Impression(s) / ED Diagnoses Final diagnoses:  Hematoma  Contusion of right thigh, initial encounter    Rx / DC Orders ED Discharge  Orders     None         Kayleeann Huxford A, PA-C 01/13/23 1658    Lacretia Leigh, MD 01/14/23 [phone]

## 2023-05-29 ENCOUNTER — Other Ambulatory Visit: Payer: Self-pay | Admitting: Cardiology

## 2023-05-29 DIAGNOSIS — I4821 Permanent atrial fibrillation: Secondary | ICD-10-CM

## 2023-05-29 NOTE — Telephone Encounter (Signed)
Prescription refill request for Eliquis received. Indication: Afib  Last office visit: 08/03/22 ([facility])  Scr: 0.53 (01/13/23)  Age: 82 Weight: 87.5kg  Appropriate dose. Refill sent.

## 2023-06-17 ENCOUNTER — Other Ambulatory Visit: Payer: Self-pay | Admitting: Family Medicine

## 2023-06-17 DIAGNOSIS — R918 Other nonspecific abnormal finding of lung field: Secondary | ICD-10-CM

## 2023-07-11 ENCOUNTER — Ambulatory Visit
Admission: RE | Admit: 2023-07-11 | Discharge: 2023-07-11 | Disposition: A | Discharge status: Home / Self Care | Payer: Federal, State, Local not specified - PPO | Source: Ambulatory Visit | Attending: Family Medicine | Admitting: Family Medicine

## 2023-07-11 DIAGNOSIS — R918 Other nonspecific abnormal finding of lung field: Secondary | ICD-10-CM

## 2023-08-21 DIAGNOSIS — I10 Essential (primary) hypertension: Secondary | ICD-10-CM | POA: Insufficient documentation

## 2023-08-21 NOTE — Progress Notes (Unsigned)
Cardiology Office Note:   Date:  08/22/2023  ID:  Tanner Richardson., DOB 02/22/1941, MRN [id number] PCP: Tanner Retort, MD  [facility] Providers Cardiologist:  Rollene Rotunda, MD {  History of Present Illness:   Tanner Richardson Tanner Richardson. is a 82 y.o. male who presents for evaluation of atrial fib.  He was seen by Dr. Donnie Aho.   He had DCCV.  Since I saw him he was kicked in the thigh by a young horse and had a large hematoma.  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  The patient denies any new symptoms such as chest discomfort, neck or arm discomfort. There has been no new shortness of breath, PND or orthopnea. There have been no reported palpitations, presyncope or syncope.     Of note he did have a CT recently to look at some lung nodules.  There was mention of aortic calcification coronary artery calcification as well as cardiomegaly. syncope or syncope.   ROS: As stated in the HPI and negative for all other systems.  Studies Reviewed:    EKG:   EKG Interpretation Date/Time:  Thursday August 22 2023 09:47:44 EDT Ventricular Rate:  60 PR Interval:    QRS Duration:  84 QT Interval:  406 QTC Calculation: 406 R Axis:   92  Text Interpretation: Atrial fibrillation Rightward axis ST & T wave abnormality, consider inferior ischemia When compared with ECG of 30-May-2016 12:38, Atrial fibrillation has replaced Sinus rhythm Nonspecific T wave abnormality has replaced inverted T waves in Anterolateral leads QT has shortened Confirmed by Rollene Rotunda ([id number]) on 08/22/2023 10:03:12 AM     Risk Assessment/Calculations:    CHA2DS2-VASc Score = 3   This indicates a 3.2% annual risk of stroke. The patient's score is based upon: CHF History: 0 HTN History: 1 Diabetes History: 0 Stroke History: 0 Vascular Disease History: 0 Age Score: 2 Gender Score: 0       Physical Exam:   VS:  BP 126/84   Pulse 60   Ht 5\' 11"  (1.803 m)    Wt 189 lb 3.2 oz (85.8 kg)   SpO2 96%   BMI 26.39 kg/m    Wt Readings from Last 3 Encounters:  08/22/23 189 lb 3.2 oz (85.8 kg)  01/13/23 193 lb (87.5 kg)  10/04/22 195 lb (88.5 kg)     GEN: Well nourished, well developed in no acute distress NECK: No JVD; No carotid bruits CARDIAC: Irregular RR, no murmurs, rubs, gallops RESPIRATORY: Bilateral basal crackles. ABDOMEN: Soft, non-tender, non-distended EXTREMITIES:  No edema; No deformity   ASSESSMENT AND PLAN:   PERMANENT ATRIAL FIB:   He has permanent atrial fibrillation and he tolerates this.  No change in therapy.  Do not think there is any reason to try to pursue rhythm management as he has had good rate control.   DYSLIPIDEMIA:   LDL 97 with an HDL of 72.  No change in therapy.   AORTIC ATHEROSCLEROSIS: He will continue with risk reduction.  No change in therapy.   HTN:   The blood pressure is at target.  No change in therapy.  CARDIOMEGALY: He did have some LVH years ago when he had an echo done by another cardiologist.  He had some mild mitral regurgitation.  I will go ahead and order an echocardiogram.       Follow up with me in one year.   Signed, Rollene Rotunda, MD

## 2023-08-22 ENCOUNTER — Encounter: Payer: Self-pay | Admitting: Cardiology

## 2023-08-22 ENCOUNTER — Ambulatory Visit: Payer: Federal, State, Local not specified - PPO | Attending: Cardiology | Admitting: Cardiology

## 2023-08-22 VITALS — BP 126/84 | HR 60 | Ht 71.0 in | Wt 189.2 lb

## 2023-08-22 DIAGNOSIS — I10 Essential (primary) hypertension: Secondary | ICD-10-CM | POA: Diagnosis not present

## 2023-08-22 DIAGNOSIS — I4821 Permanent atrial fibrillation: Secondary | ICD-10-CM | POA: Diagnosis not present

## 2023-08-22 DIAGNOSIS — I429 Cardiomyopathy, unspecified: Secondary | ICD-10-CM

## 2023-08-22 DIAGNOSIS — I700 Atherosclerosis of aorta: Secondary | ICD-10-CM

## 2023-08-22 DIAGNOSIS — E785 Hyperlipidemia, unspecified: Secondary | ICD-10-CM

## 2023-08-22 DIAGNOSIS — I517 Cardiomegaly: Secondary | ICD-10-CM

## 2023-08-22 NOTE — Patient Instructions (Signed)
  Testing/Procedures: Your physician has requested that you have an echocardiogram. Echocardiography is a painless test that uses sound waves to create images of your heart. It provides your doctor with information about the size and shape of your heart and how well your heart's chambers and valves are working. This procedure takes approximately one hour. There are no restrictions for this procedure. To be completed at [address]. Please do NOT wear cologne, perfume, aftershave, or lotions (deodorant is allowed). Please arrive 15 minutes prior to your appointment time.    Follow-Up: At [facility], you and your health needs are our priority.  As part of our continuing mission to provide you with exceptional heart care, we have created designated Provider Care Teams.  These Care Teams include your primary Cardiologist (physician) and Advanced Practice Providers (APPs -  Physician Assistants and Nurse Practitioners) who all work together to provide you with the care you need, when you need it.  We recommend signing up for the patient portal called "MyChart".  Sign up information is provided on this After Visit Summary.  MyChart is used to connect with patients for Virtual Visits (Telemedicine).  Patients are able to view lab/test results, encounter notes, upcoming appointments, etc.  Non-urgent messages can be sent to your provider as well.   To learn more about what you can do with MyChart, go to [email or web address].    Your next appointment:   1 year(s)  Provider:   Rollene Rotunda, MD

## 2023-09-18 ENCOUNTER — Telehealth (HOSPITAL_BASED_OUTPATIENT_CLINIC_OR_DEPARTMENT_OTHER): Payer: Self-pay | Admitting: *Deleted

## 2023-09-18 NOTE — Telephone Encounter (Signed)
   Pre-operative Risk Assessment    Patient Name: Tanner Richardson.  DOB: 05-21-1941 MRN: [id number]   Last OV: 08/22/2023 Upcoming OV: None  Request for Surgical Clearance    Procedure:   Colonoscopy  Date of Surgery:  Clearance 10/17/23                                 Surgeon:  Dr. Marca Ancona Surgeon's Group or Practice Name:  [facility] Phone number:  [phone] Fax number:  [phone]   Type of Clearance Requested:   - Medical  - Pharmacy:  Hold Apixaban (Eliquis) 1-2 days prior   Type of Anesthesia:   Propofol   Additional requests/questions:    Signed, Emmit Pomfret   09/18/2023, 12:06 PM

## 2023-09-19 NOTE — Telephone Encounter (Signed)
   Patient Name: Tanner Richardson.  DOB: [birth year] MRN: [id number]  Primary Cardiologist: Rollene Rotunda, MD Procedure: Colonoscopy on 10/17/23   Chart reviewed as part of pre-operative protocol coverage. Discussed with Dr. Antoine Poche, given past medical history and time since last visit, based on ACC/AHA guidelines, Demarri W Quinntin Miedema. is at acceptable risk for the planned procedure without further cardiovascular testing.   Per office protocol, patient can hold Eliquis for 1-2 days prior to procedure.  Please resume Eliquis as soon as possible, when safe to do so from a bleeding standpoint.  I will route this recommendation to the requesting party via Epic fax function and remove from pre-op pool.  Please call with questions.  Rip Harbour, NP 09/19/2023, 3:50 PM

## 2023-09-19 NOTE — Telephone Encounter (Signed)
Patient with diagnosis of afib on Eliquis for anticoagulation.    Procedure: Colonoscopy  Date of procedure: 10/17/2023   CHA2DS2-VASc Score = 3   This indicates a 3.2% annual risk of stroke. The patient's score is based upon: CHF History: 0 HTN History: 1 Diabetes History: 0 Stroke History: 0 Vascular Disease History: 0 Age Score: 2 Gender Score: 0     CrCl >100 mL/min (SrCr 0.53 01/13/2023) Platelet count 209 (12/2022)    Per office protocol, patient can hold Eliquis for 1-2 days prior to procedure.     **This guidance is not considered finalized until pre-operative APP has relayed final recommendations.**

## 2023-09-23 ENCOUNTER — Ambulatory Visit (HOSPITAL_COMMUNITY): Payer: Federal, State, Local not specified - PPO | Attending: Cardiology

## 2023-09-23 DIAGNOSIS — I517 Cardiomegaly: Secondary | ICD-10-CM | POA: Insufficient documentation

## 2023-09-23 DIAGNOSIS — I429 Cardiomyopathy, unspecified: Secondary | ICD-10-CM | POA: Diagnosis present

## 2023-09-23 LAB — ECHOCARDIOGRAM COMPLETE
Area-P 1/2: 3.21 cm2
S' Lateral: 3.3 cm

## 2023-11-28 ENCOUNTER — Ambulatory Visit (INDEPENDENT_AMBULATORY_CARE_PROVIDER_SITE_OTHER): Payer: Federal, State, Local not specified - PPO

## 2023-11-28 ENCOUNTER — Ambulatory Visit: Payer: Federal, State, Local not specified - PPO | Admitting: Podiatry

## 2023-11-28 ENCOUNTER — Encounter: Payer: Self-pay | Admitting: Podiatry

## 2023-11-28 VITALS — Ht 71.0 in | Wt 189.0 lb

## 2023-11-28 DIAGNOSIS — M2042 Other hammer toe(s) (acquired), left foot: Secondary | ICD-10-CM

## 2023-11-28 DIAGNOSIS — I872 Venous insufficiency (chronic) (peripheral): Secondary | ICD-10-CM

## 2023-11-28 NOTE — Progress Notes (Signed)
  Subjective:  Patient ID: Tanner Landsberg., male    DOB: 1941-09-30,  MRN: [id number]  Chief Complaint  Patient presents with   Foot Pain    He has noticed x 2 months the 3rd toe on left foot starting to curl under more and as the day goes on he has more pain, he can only take tylenol due to being on eliquis.      Discussed the use of AI scribe software for clinical note transcription with the patient, who gave verbal consent to proceed.  History of Present Illness   The patient presents with pain in the third toe of the left foot.  Pain is localized at the tip of the third toe on the left foot, worsening as the day progresses. There is no history of trauma or injury to the toe, such as stubbing. Initially, he experienced irritation on the top joint of the toe, which was managed with paper tape, alleviating irritation but progressing to the current pain at the tip.  He is actively involved in horseback riding, frequently engaging in the activity when weather permits. The left foot is used more during mounting, potentially contributing to discomfort.  He is currently taking Eliquis. No diagnosis of diabetes.          Objective:    Physical Exam   MUSCULOSKELETAL: Palpable pulses, foot warm and well perfused, mallet toe contracture of the second and third toes, semi-reducible, pain on the tip of the third toe. SKIN: Mild hemosiderosis in the dorsolateral foot.       No images are attached to the encounter.    Results    Left foot radiographs taken show digital contractures with degenerative changes in the second and third PIPJ and DIPJ     Assessment:   1. 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  Assessment and Plan    Mallet Toe/Hammer Toe Pain at the tip of the third toe on the left foot, worsens as the day progresses. Arthritis in the  joint causing the knuckle to be rigid. Discussed non-operative and operative treatment options. -Start with non-operative treatment: padding and offloading with silicone toe cap and toe crest. -If non-operative treatment is unsuccessful, consider operative treatment: flexor tenotomy in the office or digital arthroplasty in the operating room.  Hemosiderosis Discoloration on the foot due to vein function and hemosiderosis. Patient already wearing compression socks. -Continue wearing compression socks to prevent progression. -If it becomes inflamed or itchy, apply cortisone cream.  Follow-up in one month or as needed.          Return in about 1 month (around 12/27/2023) for hammertoe follow up (possible flexor tenotomy L 3rd).

## 2023-11-28 NOTE — Patient Instructions (Addendum)
More silicone pads can be purchased from:  [email or web address]   (Toe cap mini and toe crest left medium)    VISIT SUMMARY:  Today, we discussed the pain you are experiencing in the third toe of your left foot, which worsens as the day progresses. We also reviewed the discoloration on your foot due to vein function and hemosiderosis.  YOUR PLAN:  -MALLET TOE/HAMMER TOE: Mallet toe or hammer toe is a condition where the joint in the toe becomes bent, causing pain and discomfort. We will start with non-operative treatments, including padding and offloading with a silicone toe cap and toe crest. If these methods do not provide relief, we may consider surgical options such as a flexor tenotomy or digital arthroplasty.  -HEMOSIDEROSIS: Hemosiderosis is a condition where iron deposits cause discoloration of the skin, often due to vein issues. You should continue wearing compression socks to prevent further progression. If the area becomes inflamed or itchy, apply cortisone cream.  INSTRUCTIONS:  Please follow up in one month or as needed.

## 2023-12-02 ENCOUNTER — Other Ambulatory Visit: Payer: Self-pay

## 2023-12-02 DIAGNOSIS — I4821 Permanent atrial fibrillation: Secondary | ICD-10-CM

## 2023-12-02 MED ORDER — APIXABAN 5 MG PO TABS: 5.0000 mg | ORAL_TABLET | Freq: Two times a day (BID) | ORAL | 1 refills | Status: DC

## 2023-12-02 NOTE — Telephone Encounter (Signed)
Prescription refill request for Eliquis received. Indication:afib Last office visit:10/24 Scr:0.53  3/24 Age: 83 Weight:85.7  kg  Prescription refilled

## 2024-01-06 ENCOUNTER — Ambulatory Visit: Payer: Federal, State, Local not specified - PPO | Admitting: Podiatry

## 2024-06-01 ENCOUNTER — Other Ambulatory Visit: Payer: Self-pay | Admitting: Cardiology

## 2024-06-01 DIAGNOSIS — I4821 Permanent atrial fibrillation: Secondary | ICD-10-CM

## 2024-06-01 NOTE — Telephone Encounter (Signed)
 Prescription refill request for Eliquis  received. Indication: AF Last office visit: 08/22/23  JINNY Schilling MD Scr:0.53 on 01/13/23  Epic Age: 83 Weight: 85.8kg  Based on above findings Eliquis  5mg  twice daily is the appropriate dose.  Refill approved.  Pt is due appt with Dr Schilling and labs in October.  Message sent to schedulers.
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
## 2024-08-26 ENCOUNTER — Ambulatory Visit: Attending: Cardiology | Admitting: Cardiology

## 2024-08-26 ENCOUNTER — Encounter: Payer: Self-pay | Admitting: Cardiology

## 2024-08-26 VITALS — BP 126/60 | HR 65 | Ht 71.0 in | Wt 200.0 lb

## 2024-08-26 DIAGNOSIS — I700 Atherosclerosis of aorta: Secondary | ICD-10-CM | POA: Diagnosis not present

## 2024-08-26 DIAGNOSIS — I10 Essential (primary) hypertension: Secondary | ICD-10-CM

## 2024-08-26 DIAGNOSIS — I4821 Permanent atrial fibrillation: Secondary | ICD-10-CM | POA: Diagnosis not present

## 2024-08-26 DIAGNOSIS — E785 Hyperlipidemia, unspecified: Secondary | ICD-10-CM

## 2024-08-26 DIAGNOSIS — I517 Cardiomegaly: Secondary | ICD-10-CM

## 2024-08-26 NOTE — Patient Instructions (Signed)
 Medication Instructions:  Your physician recommends that you continue on your current medications as directed. Please refer to the Current Medication list given to you today.  *If you need a refill on your cardiac medications before your next appointment, please call your pharmacy*  Lab Work: CBC, TSH today at [facility] If you have labs (blood work) drawn today and your tests are completely normal, you will receive your results only by: MyChart Message (if you have MyChart) OR A paper copy in the mail If you have any lab test that is abnormal or we need to change your treatment, we will call you to review the results.  Testing/Procedures: NONE  Follow-Up: At [facility], you and your health needs are our priority.  As part of our continuing mission to provide you with exceptional heart care, our providers are all part of one team.  This team includes your primary Cardiologist (physician) and Advanced Practice Providers or APPs (Physician Assistants and Nurse Practitioners) who all work together to provide you with the care you need, when you need it.  Your next appointment:   1 year(s)  Provider:   Lynwood Schilling, MD    We recommend signing up for the patient portal called MyChart.  Sign up information is provided on this After Visit Summary.  MyChart is used to connect with patients for Virtual Visits (Telemedicine).  Patients are able to view lab/test results, encounter notes, upcoming appointments, etc.  Non-urgent messages can be sent to your provider as well.   To learn more about what you can do with MyChart, go to [email or web address].

## 2024-08-27 LAB — CBC
Hematocrit: 45.3 % (ref 37.5–51.0)
Hemoglobin: 15.1 g/dL (ref 13.0–17.7)
MCH: 34.5 pg — ABNORMAL HIGH (ref 26.6–33.0)
MCHC: 33.3 g/dL (ref 31.5–35.7)
MCV: 103 fL — ABNORMAL HIGH (ref 79–97)
Platelets: 219 x10E3/uL (ref 150–450)
RBC: 4.38 x10E6/uL (ref 4.14–5.80)
RDW: 12.7 % (ref 11.6–15.4)
WBC: 3.7 x10E3/uL (ref 3.4–10.8)

## 2024-08-27 LAB — TSH: TSH: 1.11 u[IU]/mL (ref 0.450–4.500)

## 2024-08-30 ENCOUNTER — Ambulatory Visit: Payer: Self-pay | Admitting: Cardiology

## 2024-10-05 ENCOUNTER — Other Ambulatory Visit: Payer: Self-pay | Admitting: Cardiology

## 2024-10-05 DIAGNOSIS — I4821 Permanent atrial fibrillation: Secondary | ICD-10-CM

## 2024-10-05 NOTE — Telephone Encounter (Signed)
 Prescription refill request for Eliquis  received. Indication: AF Last office visit: 08/26/24  Tanner Schilling MD Scr: 0.53 on 01/13/23  Epic Age: 83 Weight: 90.7kg  Based on above findings Eliquis  5mg  twice daily is the appropriate dose.  Refill approved.

## 2024-11-03 ENCOUNTER — Other Ambulatory Visit: Payer: Self-pay | Admitting: Cardiology

## 2024-11-03 DIAGNOSIS — I4821 Permanent atrial fibrillation: Secondary | ICD-10-CM

## 2024-11-03 NOTE — Telephone Encounter (Signed)
 Prescription refill request for Eliquis  received. Indication:afib Last office visit:10/25 Scr: 0.61  2025 Age:84 Weight:90.7  kg  Prescription refilled
